# Patient Record
Sex: Male | Born: 1935 | Race: White | Hispanic: No | Smoking: Never smoker
Health system: Southern US, Community
[De-identification: ages and names within clinical notes are randomized; demographics above are authoritative.]

## PROBLEM LIST (undated history)

## (undated) DIAGNOSIS — H3530 Unspecified macular degeneration: Secondary | ICD-10-CM

## (undated) DIAGNOSIS — D649 Anemia, unspecified: Secondary | ICD-10-CM

## (undated) DIAGNOSIS — I4891 Unspecified atrial fibrillation: Secondary | ICD-10-CM

## (undated) DIAGNOSIS — E785 Hyperlipidemia, unspecified: Secondary | ICD-10-CM

## (undated) DIAGNOSIS — N400 Enlarged prostate without lower urinary tract symptoms: Secondary | ICD-10-CM

## (undated) DIAGNOSIS — G20 Parkinson's disease: Secondary | ICD-10-CM

## (undated) DIAGNOSIS — G20C Parkinsonism, unspecified: Secondary | ICD-10-CM

## (undated) HISTORY — PX: TONSILECTOMY/ADENOIDECTOMY WITH MYRINGOTOMY: SHX6125

## (undated) HISTORY — DX: Unspecified macular degeneration: H35.30

## (undated) HISTORY — DX: Unspecified atrial fibrillation: I48.91

## (undated) HISTORY — DX: Benign prostatic hyperplasia without lower urinary tract symptoms: N40.0

## (undated) HISTORY — DX: Hyperlipidemia, unspecified: E78.5

## (undated) HISTORY — DX: Anemia, unspecified: D64.9

---

## 2004-05-09 ENCOUNTER — Ambulatory Visit: Payer: Self-pay | Admitting: Family Medicine

## 2004-09-03 ENCOUNTER — Ambulatory Visit: Payer: Self-pay | Admitting: Family Medicine

## 2004-11-23 ENCOUNTER — Ambulatory Visit: Payer: Self-pay | Admitting: Family Medicine

## 2005-09-18 ENCOUNTER — Ambulatory Visit: Payer: Self-pay | Admitting: Family Medicine

## 2005-09-29 ENCOUNTER — Ambulatory Visit: Payer: Self-pay | Admitting: Family Medicine

## 2006-03-27 ENCOUNTER — Ambulatory Visit: Payer: Self-pay | Admitting: Family Medicine

## 2006-09-30 ENCOUNTER — Telehealth: Payer: Self-pay | Admitting: Family Medicine

## 2006-10-01 ENCOUNTER — Telehealth: Payer: Self-pay | Admitting: Family Medicine

## 2006-10-14 ENCOUNTER — Ambulatory Visit: Payer: Self-pay | Admitting: Family Medicine

## 2006-10-14 DIAGNOSIS — N390 Urinary tract infection, site not specified: Secondary | ICD-10-CM | POA: Insufficient documentation

## 2006-10-14 DIAGNOSIS — E039 Hypothyroidism, unspecified: Secondary | ICD-10-CM | POA: Insufficient documentation

## 2006-10-14 DIAGNOSIS — I10 Essential (primary) hypertension: Secondary | ICD-10-CM

## 2006-10-14 DIAGNOSIS — M810 Age-related osteoporosis without current pathological fracture: Secondary | ICD-10-CM | POA: Insufficient documentation

## 2006-10-15 LAB — CONVERTED CEMR LAB
AST: 25 units/L (ref 0–37)
Bilirubin, Direct: 0.2 mg/dL (ref 0.0–0.3)
Chloride: 108 meq/L (ref 96–112)
Eosinophils Absolute: 0.1 10*3/uL (ref 0.0–0.6)
Eosinophils Relative: 2.1 % (ref 0.0–5.0)
GFR calc non Af Amer: 76 mL/min
Glucose, Bld: 95 mg/dL (ref 70–99)
HCT: 47 % (ref 39.0–52.0)
Lymphocytes Relative: 26.6 % (ref 12.0–46.0)
MCV: 91.9 fL (ref 78.0–100.0)
Neutrophils Relative %: 59.5 % (ref 43.0–77.0)
PSA: 5.02 ng/mL — ABNORMAL HIGH (ref 0.10–4.00)
RBC: 5.12 M/uL (ref 4.22–5.81)
Sodium: 144 meq/L (ref 135–145)
Total Bilirubin: 1.3 mg/dL — ABNORMAL HIGH (ref 0.3–1.2)
Total Protein: 6.8 g/dL (ref 6.0–8.3)
Triglycerides: 138 mg/dL (ref 0–149)
WBC: 6.1 10*3/uL (ref 4.5–10.5)

## 2006-11-03 ENCOUNTER — Telehealth: Payer: Self-pay | Admitting: Family Medicine

## 2006-11-20 ENCOUNTER — Telehealth: Payer: Self-pay | Admitting: Family Medicine

## 2006-11-23 ENCOUNTER — Telehealth: Payer: Self-pay | Admitting: Family Medicine

## 2006-12-31 ENCOUNTER — Encounter: Payer: Self-pay | Admitting: Family Medicine

## 2007-02-10 ENCOUNTER — Ambulatory Visit: Payer: Self-pay | Admitting: Family Medicine

## 2007-02-10 DIAGNOSIS — Z87898 Personal history of other specified conditions: Secondary | ICD-10-CM

## 2007-02-23 ENCOUNTER — Ambulatory Visit: Payer: Self-pay

## 2007-02-23 ENCOUNTER — Encounter: Payer: Self-pay | Admitting: Family Medicine

## 2007-03-16 ENCOUNTER — Telehealth: Payer: Self-pay | Admitting: Family Medicine

## 2007-11-01 ENCOUNTER — Telehealth: Payer: Self-pay | Admitting: Family Medicine

## 2007-11-10 ENCOUNTER — Ambulatory Visit: Payer: Self-pay | Admitting: Family Medicine

## 2007-11-10 DIAGNOSIS — E785 Hyperlipidemia, unspecified: Secondary | ICD-10-CM

## 2007-11-10 DIAGNOSIS — D649 Anemia, unspecified: Secondary | ICD-10-CM

## 2007-11-10 LAB — CONVERTED CEMR LAB
Blood in Urine, dipstick: NEGATIVE
Nitrite: NEGATIVE
Protein, U semiquant: NEGATIVE
Urobilinogen, UA: 0.2
WBC Urine, dipstick: NEGATIVE

## 2007-11-15 LAB — CONVERTED CEMR LAB
ALT: 20 units/L (ref 0–53)
AST: 25 units/L (ref 0–37)
Alkaline Phosphatase: 41 units/L (ref 39–117)
BUN: 14 mg/dL (ref 6–23)
Basophils Absolute: 0 10*3/uL (ref 0.0–0.1)
Basophils Relative: 0.4 % (ref 0.0–3.0)
CO2: 29 meq/L (ref 19–32)
Chloride: 108 meq/L (ref 96–112)
Creatinine, Ser: 0.8 mg/dL (ref 0.4–1.5)
Direct LDL: 116.3 mg/dL
Eosinophils Relative: 1.6 % (ref 0.0–5.0)
HDL: 49.6 mg/dL (ref >39.0)
Lymphocytes Relative: 31.8 % (ref 12.0–46.0)
Neutrophils Relative %: 55.5 % (ref 43.0–77.0)
Platelets: 237 10*3/uL (ref 150–400)
Potassium: 4.1 meq/L (ref 3.5–5.1)
RBC: 4.64 M/uL (ref 4.22–5.81)
Total Bilirubin: 1.3 mg/dL — ABNORMAL HIGH (ref 0.3–1.2)
VLDL: 22 mg/dL (ref 0–40)
Vit D, 1,25-Dihydroxy: 49 (ref 30–89)
WBC: 6.2 10*3/uL (ref 4.5–10.5)

## 2007-11-17 ENCOUNTER — Encounter: Payer: Self-pay | Admitting: Family Medicine

## 2007-11-29 ENCOUNTER — Ambulatory Visit: Payer: Self-pay | Admitting: Internal Medicine

## 2008-02-25 ENCOUNTER — Ambulatory Visit: Payer: Self-pay | Admitting: Family Medicine

## 2008-02-28 LAB — CONVERTED CEMR LAB: PSA: 4.9 ng/mL — ABNORMAL HIGH (ref 0.10–4.00)

## 2009-01-18 ENCOUNTER — Telehealth: Payer: Self-pay | Admitting: Family Medicine

## 2009-02-21 ENCOUNTER — Telehealth: Payer: Self-pay | Admitting: Family Medicine

## 2009-02-23 ENCOUNTER — Ambulatory Visit: Payer: Self-pay | Admitting: Family Medicine

## 2009-02-28 ENCOUNTER — Ambulatory Visit: Payer: Self-pay | Admitting: Family Medicine

## 2009-02-28 DIAGNOSIS — R3915 Urgency of urination: Secondary | ICD-10-CM

## 2009-02-28 LAB — CONVERTED CEMR LAB
ALT: 22 units/L (ref 0–53)
AST: 27 units/L (ref 0–37)
Basophils Absolute: 0 10*3/uL (ref 0.0–0.1)
Bilirubin, Direct: 0.2 mg/dL (ref 0.0–0.3)
CO2: 28 meq/L (ref 19–32)
Chloride: 107 meq/L (ref 96–112)
Cholesterol: 215 mg/dL — ABNORMAL HIGH (ref 0–200)
Creatinine, Ser: 1 mg/dL (ref 0.4–1.5)
Direct LDL: 137.3 mg/dL
Eosinophils Relative: 3.3 % (ref 0.0–5.0)
HCT: 44 % (ref 39.0–52.0)
HDL: 52.6 mg/dL (ref >39.00)
Ketones, ur: NEGATIVE mg/dL
Leukocytes, UA: NEGATIVE
Lymphocytes Relative: 40.5 % (ref 12.0–46.0)
Lymphs Abs: 2.1 10*3/uL (ref 0.7–4.0)
Monocytes Relative: 11.5 % (ref 3.0–12.0)
Neutrophils Relative %: 44.5 % (ref 43.0–77.0)
Platelets: 209 10*3/uL (ref 150.0–400.0)
Potassium: 4.3 meq/L (ref 3.5–5.1)
RDW: 12.5 % (ref 11.5–14.6)
Specific Gravity, Urine: >=1.03 (ref 1.000–1.030)
TSH: 1.6 microintl units/mL (ref 0.35–5.50)
Total Bilirubin: 1 mg/dL (ref 0.3–1.2)
Total CHOL/HDL Ratio: 4
Triglycerides: 103 mg/dL (ref 0.0–149.0)
Urine Glucose: NEGATIVE mg/dL
Urobilinogen, UA: 0.2 (ref 0.0–1.0)
VLDL: 20.6 mg/dL (ref 0.0–40.0)
WBC: 5.2 10*3/uL (ref 4.5–10.5)
pH: 5.5 (ref 5.0–8.0)

## 2009-03-14 ENCOUNTER — Telehealth: Payer: Self-pay | Admitting: Family Medicine

## 2009-06-04 ENCOUNTER — Telehealth: Payer: Self-pay | Admitting: *Deleted

## 2009-09-28 ENCOUNTER — Ambulatory Visit: Payer: Self-pay | Admitting: Internal Medicine

## 2009-09-28 DIAGNOSIS — F980 Enuresis not due to a substance or known physiological condition: Secondary | ICD-10-CM

## 2009-10-01 ENCOUNTER — Encounter: Payer: Self-pay | Admitting: Internal Medicine

## 2009-10-01 ENCOUNTER — Telehealth: Payer: Self-pay | Admitting: Internal Medicine

## 2009-10-01 DIAGNOSIS — R300 Dysuria: Secondary | ICD-10-CM | POA: Insufficient documentation

## 2009-10-08 ENCOUNTER — Telehealth: Payer: Self-pay | Admitting: Internal Medicine

## 2009-10-23 ENCOUNTER — Telehealth: Payer: Self-pay | Admitting: Internal Medicine

## 2009-10-29 ENCOUNTER — Encounter: Payer: Self-pay | Admitting: Internal Medicine

## 2009-10-30 ENCOUNTER — Telehealth: Payer: Self-pay | Admitting: Family Medicine

## 2009-11-28 ENCOUNTER — Encounter: Payer: Self-pay | Admitting: Internal Medicine

## 2009-12-27 ENCOUNTER — Encounter: Payer: Self-pay | Admitting: Internal Medicine

## 2010-01-29 ENCOUNTER — Encounter: Payer: Self-pay | Admitting: Internal Medicine

## 2010-02-12 NOTE — Progress Notes (Signed)
Summary: samples  Phone Note Call from Patient Call back at Home Phone [phone]   Caller: Patient Call For: Judithann Sheen MD Summary of Call: was given samples Toviaz 4mg - maybe helps- wants to know if you have any samples of the 8mg ? Initial call taken by: Raechel Ache, RN,  March 14, 2009 9:22 AM  Follow-up for Phone Call        i only have 1 box of 14 tablets that i will leave up front for him  Follow-up by: Pura Spice, RN,  March 14, 2009 9:40 AM  Additional Follow-up for Phone Call Additional follow up Details #1::        Phone Call Completed Additional Follow-up by: Raechel Ache, RN,  March 14, 2009 9:47 AM

## 2010-02-12 NOTE — Progress Notes (Signed)
Summary: refills  Phone Note Refill Request Message from:  Fax from Pharmacy  Refills Requested: Medication #1:  ENABLEX 15 MG TB24 once daily  Medication #2:  FLOMAX 0.4 MG  CP24 once daily   Brand Name Necessary? No [facility] [phone] fax---[phone]  Initial call taken by: Warnell Forester,  February 21, 2009 8:51 AM  Follow-up for Phone Call        ok pt has cpx on Feb 28 2009  Follow-up by: Pura Spice, RN,  February 21, 2009 9:38 AM    New/Updated Medications: FLOMAX 0.4 MG  CP24 (TAMSULOSIN HCL) once daily Prescriptions: ENABLEX 15 MG TB24 (DARIFENACIN HYDROBROMIDE) once daily  #30 x 1   Entered by:   Pura Spice, RN   Authorized by:   Judithann Sheen MD   Signed by:   Pura Spice, RN on 02/21/2009   Method used:   Electronically to        [facility]* (retail)       [address]       [place], [place]  [address]       Ph: [phone]       Fax: [phone]   RxID:   [id number] FLOMAX 0.4 MG  CP24 (TAMSULOSIN HCL) once daily  #30 x 1   Entered by:   Pura Spice, RN   Authorized by:   Judithann Sheen MD   Signed by:   Pura Spice, RN on 02/21/2009   Method used:   Electronically to        [facility]* (retail)       [address]       [place], [place]  [address]       Ph: [phone]       Fax: [phone]   RxID:   [id number]

## 2010-02-12 NOTE — Progress Notes (Signed)
Summary: refill jayln  Phone Note Call from Patient   Caller: Patient Reason for Call: Talk to Doctor Summary of Call: needs rx for jaylyn to [place].    I see both jayln and flomax - please advise. KIK Initial call taken by: Duard Brady LPN,  October 23, 2009 4:45 PM  Follow-up for Phone Call        called pt - he has been instructed to take both - 2 by mouth once daily -  flomax was refilled in sept.   please advise ok to fill 2 jayln once daily to [place]. KIK Follow-up by: Duard Brady LPN,  October 23, 2009 4:49 PM  Additional Follow-up for Phone Call Additional follow up Details #1::        Jaylyn  ONE daily  #90  Additional Follow-up by: Gordy Savers  MD,  October 23, 2009 5:00 PM    Prescriptions: Haynes Bast 0.5-0.4 MG CAPS (DUTASTERIDE-TAMSULOSIN HCL) one daily  #50 x 3   Entered by:   Duard Brady LPN   Authorized by:   Gordy Savers  MD   Signed by:   Duard Brady LPN on 11/09/2534   Method used:   Faxed to ...       [facility]* (retail)       [address]       [place], [place]  [address]       Ph: [phone]       Fax: [phone]   RxID:   [id number]

## 2010-02-12 NOTE — Assessment & Plan Note (Signed)
Summary: dysuria/dm   Vital Signs:  Patient profile:   77 year old male Weight:      183 pounds Temp:     97.7 degrees F oral BP sitting:   140 / 80  (right arm) Cuff size:   large  Vitals Entered By: Duard Brady LPN (September 28, 2009 12:38 PM) CC: c/o difficult urination , low back pain  Is Patient Diabetic? No   CC:  c/o difficult urination  and low back pain .  History of Present Illness: 77 year old patient who has a history of BPH and a PSA level that ranges from 5 to 6.  He has been on Flomax for some time and more recently enablex has been added to his regimen due to urinary frequency.  He uses enablex in the morning and Flomax at bedtime.  throughout the day, he has noted decreasing  voiding intervals and earlier in the day is able to go  3 hours without urgency and need to void.  Towards the end of the day.  He has more frequency and urgency.  For the past couple of days.  He has had significant urgency with pain and inability to void late in the day and during the night.  Allergies: 1)  ! Pcn 2)  ! Sulfa  Physical Exam  General:  Well-developed,well-nourished,in no acute distress; alert,appropriate and cooperative throughout examination   Impression & Recommendations:  Problem # 1:  URINARY URGENCY (ZOX-096.04)  Problem # 2:  BENIGN PROSTATIC HYPERTROPHY, MILD, HX OF (ICD-V13.8) will discontinue enablex and give 6 weeks of Jalyn  Complete Medication List: 1)  Flomax 0.4 Mg Cp24 (Tamsulosin hcl) .... Once daily 2)  Adult Aspirin Low Strength 81 Mg Tbdp (Aspirin) 3)  Omega-3 350 Mg Caps (Omega-3 fatty acids) .... 3 am and 3 hs 4)  Jalyn 0.5-0.4 Mg Caps (Dutasteride-tamsulosin hcl) .... One daily 5)  Tramadol Hcl 50 Mg Tabs (Tramadol hcl) .... One every 6 hours for pain  Other Orders: UA Dipstick w/o Micro (manual) (54098)  Patient Instructions: 1)  discontinue Enablex 2)  Please schedule a follow-up appointment in 6  weeks Prescriptions: TRAMADOL  HCL 50 MG TABS (TRAMADOL HCL) one every 6 hours for pain  #50 x 2   Entered and Authorized by:   Gordy Savers  MD   Signed by:   Gordy Savers  MD on 09/28/2009   Method used:   Electronically to        [facility]* (retail)       [address]       [place], [place]  [address]       Ph: [phone]       Fax: [phone]   RxID:   [id number] Haynes Bast 0.5-0.4 MG CAPS (DUTASTERIDE-TAMSULOSIN HCL) one daily  #50 x 3   Entered and Authorized by:   Gordy Savers  MD   Signed by:   Gordy Savers  MD on 09/28/2009   Method used:   Electronically to        [facility]* (retail)       [address]       [place], [place]  [address]       Ph: [phone]       Fax: [phone]   RxID:   [id number]   Appended Document: dysuria/dm  Flu Vaccine Consent Questions     Do you have a history of severe allergic reactions to this vaccine? no  Any prior history of allergic reactions to egg and/or gelatin? no    Do you have a sensitivity to the preservative Thimersol? no    Do you have a past history of Guillan-Barre Syndrome? no    Do you currently have an acute febrile illness? no    Have you ever had a severe reaction to latex? no    Vaccine information given and explained to patient? yes    Are you currently pregnant? no    Lot Number:[id number]   Exp Date:07/13/2010   Site Given  Left Deltoid IM    Allergies: 1)  ! Pcn 2)  ! Sulfa   Complete Medication List: 1)  Flomax 0.4 Mg Cp24 (Tamsulosin hcl) .... Once daily 2)  Adult Aspirin Low Strength 81 Mg Tbdp (Aspirin) 3)  Omega-3 350 Mg Caps (Omega-3 fatty acids) .... 3 am and 3 hs 4)  Jalyn 0.5-0.4 Mg Caps (Dutasteride-tamsulosin hcl) .... One daily 5)  Tramadol Hcl 50 Mg Tabs (Tramadol hcl) .... One every 6 hours for pain  Other Orders: Admin 1st Vaccine ([id number]) Flu Vaccine 41yrs + [phone])   Laboratory Results   Urine Tests  Date/Time Received: September 28, 2009 1:26 PM  Date/Time Reported: September 28, 2009 1:26 PM   Routine Urinalysis   Color: yellow Appearance: Clear Glucose: negative   (Normal Range: Negative) Bilirubin: negative   (Normal Range: Negative) Ketone: negative   (Normal Range: Negative) Spec. Gravity: 1.020   (Normal Range: 1.003-1.035) Blood: trace-intact   (Normal Range: Negative) pH: 5.0   (Normal Range: 5.0-8.0) Protein: negative   (Normal Range: Negative) Urobilinogen: 0.2   (Normal Range: 0-1) Nitrite: negative   (Normal Range: Negative) Leukocyte Esterace: negative   (Normal Range: Negative)

## 2010-02-12 NOTE — Assessment & Plan Note (Signed)
Summary: cpx   Vital Signs:  Patient profile:   77 year old male Height:      67 inches Weight:      188 pounds O2 Sat:      98 % Temp:     97.7 degrees F Pulse rate:   62 / minute Resp:     16 per minute BP sitting:   140 / 90  (left arm) Cuff size:   large  Vitals Entered By: Pura Spice, RN (February 28, 2009 1:29 PM) CC: go over meds reillls discus meds not had a colonscopy  states saw dr Ladona Ridgel other yr and no need for return per pt.  Is Patient Diabetic? No   History of Present Illness: This 77 yr old white male who is in to dicus problems and refill medications Main complaint is in regard to some urinary urgency and occasional nocturi X 2. patient takes Flomax and flaxseed nO CONCERN ABOUT ED. ROS essentially negatic Needs medicines refilled Refuses to have colonoscopic exam  Allergies: 1)  ! Pcn 2)  ! Sulfa  Past History:  Past Medical History: Last updated: 10/12/2006 Pneumonia, hx of  Past Surgical History: Last updated: 10/12/2006 Colonoscopy  Social History: Last updated: 10/12/2006 Retired Single Never Smoked  Risk Factors: Smoking Status: never (10/12/2006)  Review of Systems      See HPI General:  See HPI; Denies chills, fatigue, fever, loss of appetite, malaise, sleep disorder, sweats, weakness, and weight loss. Eyes:  Denies blurring, discharge, double vision, eye irritation, eye pain, halos, itching, light sensitivity, red eye, vision loss-1 eye, and vision loss-both eyes. ENT:  Denies decreased hearing, difficulty swallowing, ear discharge, earache, hoarseness, nasal congestion, nosebleeds, postnasal drainage, ringing in ears, sinus pressure, and sore throat. CV:  labile hypertension. Resp:  Denies chest discomfort, chest pain with inspiration, cough, coughing up blood, excessive snoring, hypersomnolence, morning headaches, pleuritic, shortness of breath, sputum productive, and wheezing. GI:  Denies abdominal pain, bloody stools, change in  bowel habits, constipation, dark tarry stools, diarrhea, excessive appetite, gas, hemorrhoids, indigestion, loss of appetite, nausea, vomiting, vomiting blood, and yellowish skin color. GU:  Complains of nocturia; urinary urgency. Derm:  See HPI; Denies changes in color of skin, changes in nail beds, dryness, excessive perspiration, flushing, hair loss, insect bite(s), itching, lesion(s), poor wound healing, and rash. Neuro:  Denies brief paralysis, difficulty with concentration, disturbances in coordination, falling down, headaches, inability to speak, memory loss, numbness, poor balance, seizures, sensation of room spinning, tingling, tremors, visual disturbances, and weakness. Psych:  Denies alternate hallucination ( auditory/visual), anxiety, depression, easily angered, easily tearful, irritability, mental problems, panic attacks, sense of great danger, suicidal thoughts/plans, thoughts of violence, unusual visions or sounds, and thoughts /plans of harming others. Endo:  Denies cold intolerance, excessive hunger, excessive thirst, excessive urination, heat intolerance, polyuria, and weight change.  Physical Exam  General:  Well-developed,well-nourished,in no acute distress; alert,appropriate and cooperative throughout examination Head:  Normocephalic and atraumatic without obvious abnormalities. No apparent alopecia or balding. Eyes:  No corneal or conjunctival inflammation noted. EOMI. Perrla. Funduscopic exam benign, without hemorrhages, exudates or papilledema. Vision grossly normal. Ears:  External ear exam shows no significant lesions or deformities.  Otoscopic examination reveals clear canals, tympanic membranes are intact bilaterally without bulging, retraction, inflammation or discharge. Hearing is grossly normal bilaterally. Nose:  External nasal examination shows no deformity or inflammation. Nasal mucosa are pink and moist without lesions or exudates. Mouth:  Oral mucosa and oropharynx  without lesions or  exudates.  Teeth in good repair. Neck:  No deformities, masses, or tenderness noted. Chest Wall:  No deformities, masses, tenderness or gynecomastia noted. Breasts:  No masses or gynecomastia noted Lungs:  Normal respiratory effort, chest expands symmetrically. Lungs are clear to auscultation, no crackles or wheezes. Heart:  Normal rate and regular rhythm. S1 and S2 normal without gallop, murmur, click, rub or other extra sounds. Abdomen:  Bowel sounds positive,abdomen soft and non-tender without masses, organomegaly or hernias noted. Rectal:  No external abnormalities noted. Normal sphincter tone. No rectal masses or tenderness. Genitalia:  Testes bilaterally descended without nodularity, tenderness or masses. No scrotal masses or lesions. No penis lesions or urethral discharge. Prostate:  no nodules, no asymmetry, and 1+ enlarged.   Msk:  No deformity or scoliosis noted of thoracic or lumbar spine.   Pulses:  R and L carotid,radial,femoral,dorsalis pedis and posterior tibial pulses are full and equal bilaterally Extremities:  No clubbing, cyanosis, edema, or deformity noted with normal full range of motion of all joints.   Neurologic:  No cranial nerve deficits noted. Station and gait are normal. Plantar reflexes are down-going bilaterally. DTRs are symmetrical throughout. Sensory, motor and coordinative functions appear intact. Skin:  Intact without suspicious lesions or rashes Cervical Nodes:  No lymphadenopathy noted Axillary Nodes:  No palpable lymphadenopathy Inguinal Nodes:  No significant adenopathy Psych:  Cognition and judgment appear intact. Alert and cooperative with normal attention span and concentration. No apparent delusions, illusions, hallucinations   Impression & Recommendations:  Problem # 1:  HYPERLIPIDEMIA (ICD-272.4) Assessment Unchanged  Problem # 2:  BENIGN PROSTATIC HYPERTROPHY, MILD, HX OF (ICD-V13.8) Assessment: Unchanged  Problem # 3:   URINARY URGENCY (VOZ-366.44) Assessment: Unchanged  Problem # 4:  HYPERTENSION, LABILE, HX OF (ICD-V12.50) Assessment: Improved  Orders: EKG w/ Interpretation (93000)  Complete Medication List: 1)  Flomax 0.4 Mg Cp24 (Tamsulosin hcl) .... Once daily 2)  Adult Aspirin Low Strength 81 Mg Tbdp (Aspirin) 3)  Enablex 15 Mg Tb24 (Darifenacin hydrobromide) .... Once daily 4)  Omega-3 350 Mg Caps (Omega-3 fatty acids) .... 3 am and 3 hs  Patient Instructions: 1)  you've are doing very well and I recommend you continue the medications that you're on at this time lab studies are good 2)  Continue your regular exercise, riding the bicycle as you're doing Prescriptions: ENABLEX 15 MG TB24 (DARIFENACIN HYDROBROMIDE) once daily  #30 x 11   Entered and Authorized by:   Judithann Sheen MD   Signed by:   Judithann Sheen MD on 02/28/2009   Method used:   Electronically to        [facility]* (retail)       [address]       [place], [place]  [address]       Ph: [phone]       Fax: [phone]   RxID:   [id number] ENABLEX 15 MG TB24 (DARIFENACIN HYDROBROMIDE) once daily  #30 x 11   Entered and Authorized by:   Judithann Sheen MD   Signed by:   Judithann Sheen MD on 02/28/2009   Method used:   Electronically to        [facility]* (retail)       [address]       [place], [place]  [address]       Ph: [phone]       Fax: [phone]   RxID:   [id number] [facility]  0.4 MG  CP24 (TAMSULOSIN HCL) once daily  #30 x 11   Entered and Authorized by:   Judithann Sheen MD   Signed by:   Judithann Sheen MD on 02/28/2009   Method used:   Electronically to        [facility]* (retail)       [address]       [place], [place]  [address]       Ph: [phone]       Fax: [phone]   RxID:   [id number]

## 2010-02-12 NOTE — Progress Notes (Signed)
Summary: rx  enablex   Phone Note Refill Request   Refills Requested: Medication #1:  ENABLEX 15 MG TB24 once daily [facility] ph-----[phone]      fax----[phone]  Initial call taken by: Warnell Forester,  January 18, 2009 8:21 AM  Follow-up for Phone Call        ok x 1 then needs cpx appt  Follow-up by: Pura Spice, RN,  January 18, 2009 8:24 AM    New/Updated Medications: ENABLEX 15 MG TB24 (DARIFENACIN HYDROBROMIDE) once daily Prescriptions: ENABLEX 15 MG TB24 (DARIFENACIN HYDROBROMIDE) once daily  #30 x 0   Entered by:   Pura Spice, RN   Authorized by:   Judithann Sheen MD   Signed by:   Pura Spice, RN on 01/18/2009   Method used:   Electronically to        [facility]* (retail)       [address]       [place], [place]  [address]       Ph: [phone]       Fax: [phone]   RxID:   [id number]

## 2010-02-12 NOTE — Consult Note (Signed)
Summary: [facility]  [facility]   Imported By: Maryln Gottron 10/08/2009 13:35:51  _____________________________________________________________________  External Attachment:    Type:   Image     Comment:   External Document

## 2010-02-12 NOTE — Progress Notes (Signed)
Summary: flomax doubled  Phone Note Call from Patient Call back at [facility] Phone [phone]   Summary of Call: Dr. Kirtland Bouchard sent me To Dr. Gildardo Griffes for urgency.  Has done cauterization.  Plan now daily 2  Flomax and 2 Jaylen, samples, will face that when needed, but  need Rx doubled to take 2 Flomax daily to [facility].   Initial call taken by: Rudy Jew, RN,  October 08, 2009 9:58 AM  Follow-up for Phone Call        okay to refill generic, Flomax 0.4 mg, number 180 to use b.i.d. refill x 4 Follow-up by: Gordy Savers  MD,  October 08, 2009 12:23 PM    New/Updated Medications: FLOMAX 0.4 MG CAPS (TAMSULOSIN HCL) One two times a day Prescriptions: FLOMAX 0.4 MG CAPS (TAMSULOSIN HCL) One two times a day  #180 x 4   Entered by:   Rudy Jew, RN   Authorized by:   Gordy Savers  MD   Signed by:   Rudy Jew, RN on 10/08/2009   Method used:   Electronically to        [facility]* (retail)       [address]       [place], [place]  [address]       Ph: [phone]       Fax: [phone]   RxID:   [id number]

## 2010-02-12 NOTE — Letter (Signed)
Summary: [facility]  [facility]   Imported By: Maryln Gottron 11/05/2009 15:51:47  _____________________________________________________________________  External Attachment:    Type:   Image     Comment:   External Document

## 2010-02-12 NOTE — Progress Notes (Signed)
Summary: new rx needed  Phone Note Call from Patient Call back at Home Phone [phone]   Caller: Adam Garner mail Summary of Call: was referred to Dr Briant Cedar, and was rx'd samples of Jalyn. Dr Scotty Court had rx'd Avodart in the past. However, Haynes Bast is not a covered med through his ins. Avodart is only half the strength of Jalyn. Pt is requesting a new rx for Advodart  #60 per month. please call in to [facility] [place]. Initial call taken by: Warnell Forester,  October 30, 2009 9:26 AM  Follow-up for Phone Call        I don't see Avodart on pts med list? Please advise? Follow-up by: Josph Macho RMA,  October 30, 2009 9:45 AM  Additional Follow-up for Phone Call Additional follow up Details #1::        Please have patient tell us when Dr Scotty Court gave this too him. I will write for Avodart fot 2 months. 0.5mg  tab 1 tab by mouth once daily #30, 1 rf Additional Follow-up by: Danise Edge MD,  October 30, 2009 9:50 AM    Additional Follow-up for Phone Call Additional follow up Details #2::    I tried to call pt back no answer no vm Follow-up by: Josph Macho RMA,  October 30, 2009 11:08 AM  New/Updated Medications: AVODART 0.5 MG CAPS (DUTASTERIDE) 1 tab by mouth once daily Prescriptions: AVODART 0.5 MG CAPS (DUTASTERIDE) 1 tab by mouth once daily  #30 x 2   Entered by:   Josph Macho RMA   Authorized by:   Danise Edge MD   Signed by:   Josph Macho RMA on 10/30/2009   Method used:   Electronically to        [facility]* (retail)       [address]       [place], [place]  [address]       Ph: [phone]       Fax: [phone]   RxID:   [id number]

## 2010-02-12 NOTE — Progress Notes (Signed)
Summary: NEW RX  Phone Note Call from Patient Call back at Home Phone [phone]   Caller: Patient Call For: Judithann Sheen MD Summary of Call: PT [facility]  [address] [place] [phone]  Initial call taken by: Heron Sabins,  Jun 04, 2009 9:28 AM  Follow-up for Phone Call        Rx sent to pharmacy- Per Dr. Scotty Court okay to do x 1 year. Follow-up by: Romualdo Bolk, CMA (AAMA),  Jun 04, 2009 1:11 PM    Prescriptions: ENABLEX 15 MG TB24 (DARIFENACIN HYDROBROMIDE) once daily  #30 x 11   Entered by:   Romualdo Bolk, CMA (AAMA)   Authorized by:   Judithann Sheen MD   Signed by:   Romualdo Bolk, CMA (AAMA) on 06/04/2009   Method used:   Electronically to        [facility]* (retail)       [address]       [place], [place]  [address]       Ph: [phone]       Fax: [phone]   RxID:   [id number]

## 2010-02-12 NOTE — Letter (Signed)
Summary: [facility]  [facility]   Imported By: Maryln Gottron 12/04/2009 09:38:54  _____________________________________________________________________  External Attachment:    Type:   Image     Comment:   External Document

## 2010-02-12 NOTE — Progress Notes (Signed)
Summary: stopped up urine & bm Dr. Kirtland Bouchard call please  Phone Note Call from Patient Call back at Home Phone [phone]   Caller: vm Reason for Call: Talk to Doctor Summary of Call: Sat told To stop ?enaplix, gave Flomax & take Jaylen.  Locked me up completely. Pharmacist said not to take Jaylen.  Taking Flomax last night gave me good stream last night.  Am constipated.  Today cannot urinate.  What to do?  Locked up now both ways.  Dr. Kirtland Bouchard Call me ASAP.  [place].   Initial call taken by: Rudy Jew, RN,  October 01, 2009 12:19 PM  Follow-up for Phone Call        please schedule urology appt for today or tomarrow Follow-up by: Gordy Savers  MD,  October 01, 2009 12:52 PM  Additional Follow-up for Phone Call Additional follow up Details #1::        Phone Call Completed Additional Follow-up by: Rudy Jew, RN,  October 01, 2009 2:59 PM  New Problems: DYSURIA (ICD-788.1)   New Problems: DYSURIA (ICD-788.1)

## 2010-02-14 NOTE — Letter (Signed)
Summary: [facility]  [facility]   Imported By: Maryln Gottron 02/07/2010 11:02:50  _____________________________________________________________________  External Attachment:    Type:   Image     Comment:   External Document

## 2010-02-14 NOTE — Letter (Signed)
Summary: [facility]  [facility]   Imported By: Maryln Gottron 01/09/2010 11:23:33  _____________________________________________________________________  External Attachment:    Type:   Image     Comment:   External Document

## 2010-05-28 NOTE — Letter (Signed)
November 29, 2007    Tawny Asal, MD  [address] [place], [place] [address]   RE:  HOLMES, HAYS  MRN:  [id number]  /  DOB:  1921-04-13   Dear Annette Stable:   Thank you for referring Mr. Barbra Sarks for EP evaluation of bradycardia  in the setting of left ventricular hypertrophy.  I am not sure I have  all of the records available, but I do have the most recent clinic  notes.  As you know, Mr. Zinni is a very pleasant 77 year old man who has  done amazingly well despite his 77 years of age.  He has a history of  BPH, which has been for the most part controlled, as well he has a  history of stress testing back in February 2009 where the patient was  able to only walk for 3 minutes on a Bruce protocol, but had preserved  coronary perfusion and normal LV function.  There was evidence at that  time of LVH and the patient did have hypertensive response to exercise.  Despite this, the patient has really remained very stable.  He states  that he is able to exercise with a [employer] Airdyne exercise bike 5 days  a week without chest pain or shortness of breath and his energy level  and exercise abilities have really been unchanged from last year or 2.  The patient does have some bradycardia, but has never had syncope or  near syncope.  He denies chest pain or other symptoms at the present  time.  Past medical history is noted in the HPI and otherwise  unremarkable.  He does have first-degree heart block on his EKG.   SOCIAL HISTORY:  The patient is widowed.  He denies tobacco or ethanol  abuse as noted.  He exercises regularly.   FAMILY HISTORY:  Noncontributory other than his advanced age.   REVIEW OF SYSTEMS:  Negative except as noted in the HPI.  He does not  have much in the way of symptoms of BPH.   PHYSICAL EXAMINATION:  GENERAL:  Notable for him being a pleasant well-  appearing elderly man in no acute distress.  VITAL SIGNS:  The blood pressure today was 152/92, the pulse  54 and  regular, and the respirations were 18.  HEENT:  Normocephalic and atraumatic.  Pupils equal and round.  The  oropharynx is moist.  The sclerae are anicteric.  NECK:  No jugular venous distention.  There is no thyromegaly.  Trachea  is midline.  The carotid are 2+ and symmetric.  LUNGS:  Clear bilaterally to auscultation.  No wheezes, rales, or  rhonchi are present.  There is no increased work of breathing.  CARDIOVASCULAR:  Regular bradycardia with normal S1 and S2.  The PMI was  not enlarged, nor was it laterally displaced.  ABDOMEN:  Soft and nontender.  There is no organomegaly.  The bowel  sounds are present.  There is no rebound or guarding.  EXTREMITIES:  No cyanosis, clubbing, or edema.  The pulses were 2+ and  symmetric.  NEUROLOGIC:  Alert and oriented x3.  Cranial nerves intact.  Strength is  5/5 and symmetric.   The EKG demonstrated sinus bradycardia with first-degree AV block.   IMPRESSION:  1. Probable hypertension.  2. Sinus bradycardia which appears to be asymptomatic.   Annette Stable, Mr. Sangiovanni is amazingly stable at the present time.  My guess is  that serial blood pressure checks may well reveal that he  is in fact  hypertensive and if this turns out to be the case, medical therapy would  certainly be a reasonable thing.  In light of his bradycardia, beta-  blockers or nondihydropyridine calcium channel blockers might result in  more severe significant bradycardia.  An ARB would be a certainly a  consideration trial versus trying Norvasc.  The patient does have  evidence of LVH, which is mild by EKG as well as by stress perfusion  imaging, but again this is very mild and based on how well he is done  and how relatively asymptomatic, I think additional workup with 2D echo  or other testing would probably not be warranted at this time.  I have  counseled the patient that if he has symptomatic bradycardia and  discussed symptoms that he might experience with this or  other cardiac  symptoms, then I would be very happy to see him back, otherwise I will  refer back to [facility].  Please do not hesitate to contact me for  any questions regarding Mr. Alipio.  Once again, thanks for referring him  for Cardiology evaluation.    Sincerely,      Doylene Canning. Ladona Ridgel, MD  Electronically Signed    GWT/MedQ  DD: 11/29/2007  DT: 11/30/2007  Job #: [id number]

## 2010-10-31 ENCOUNTER — Other Ambulatory Visit: Payer: Self-pay | Admitting: Family Medicine

## 2010-10-31 ENCOUNTER — Encounter: Payer: Self-pay | Admitting: Family Medicine

## 2010-10-31 MED ORDER — DUTASTERIDE 0.5 MG PO CAPS: 0.5000 mg | ORAL_CAPSULE | Freq: Every day | ORAL | Status: DC

## 2010-10-31 MED ORDER — ALENDRONATE SODIUM 70 MG PO TABS: 70.0000 mg | ORAL_TABLET | ORAL | Status: DC

## 2010-11-01 ENCOUNTER — Encounter: Payer: Self-pay | Admitting: Family Medicine

## 2010-11-01 ENCOUNTER — Ambulatory Visit (INDEPENDENT_AMBULATORY_CARE_PROVIDER_SITE_OTHER): Payer: PRIVATE HEALTH INSURANCE | Admitting: Family Medicine

## 2010-11-01 DIAGNOSIS — M9981 Other biomechanical lesions of cervical region: Secondary | ICD-10-CM

## 2010-11-01 DIAGNOSIS — Z87898 Personal history of other specified conditions: Secondary | ICD-10-CM

## 2010-11-01 DIAGNOSIS — M999 Biomechanical lesion, unspecified: Secondary | ICD-10-CM

## 2010-11-01 DIAGNOSIS — Z8679 Personal history of other diseases of the circulatory system: Secondary | ICD-10-CM

## 2010-11-01 MED ORDER — TAMSULOSIN HCL 0.4 MG PO CAPS: 0.8000 mg | ORAL_CAPSULE | ORAL | Status: DC

## 2010-11-01 NOTE — Patient Instructions (Signed)
We will increase your flomax to 0.8mg  daily For the manipulation lets have you come back in 3 weeks.  Think about the cane when you go out on long walks.

## 2010-11-02 ENCOUNTER — Encounter: Payer: Self-pay | Admitting: Family Medicine

## 2010-11-02 DIAGNOSIS — M999 Biomechanical lesion, unspecified: Secondary | ICD-10-CM | POA: Insufficient documentation

## 2010-11-02 NOTE — Assessment & Plan Note (Signed)
After verbal consent pt did have muscle energy and articular technique, with marked improvement.  Gave side effects to look out for and can take anti inflammatories in the acute time frame.

## 2010-11-02 NOTE — Assessment & Plan Note (Signed)
Discussed with pt and need for better control pt will see dr. Bradd Canary and would like him to do labs in near future including PSA.

## 2010-11-02 NOTE — Progress Notes (Signed)
  Subjective:    Patient ID: Adam Garner, male    DOB: [birth year], 77 y.o.   MRN: [id number]  HPI 77 yo male here to establish care for manipulation only.  Pt has primary care in Dr. Scotty Court and goes and sees him annually  Pt also is seen by [facility] for BPH Pt give though history of his BPH and need to in and out cath self from time to time due to this problem.  Pt hough is stating that for a while he had to wear a catheter bag on his leg and noticed since then he walks more with a limp.Pt denies any pain in numbness in extremities.  Pt though over time has felt a little more unsteady on his feet over the coarse of the last year or so.  Pt denies any falls but has been a lot more careful.  Pt would like to see if any manipulation would be helpful.   Active Ambulatory Problems    Diagnosis Date Noted  . HYPOTHYROIDISM NOS 10/14/2006  . HYPERLIPIDEMIA 11/10/2007  . ANEMIA 11/10/2007  . ENURESIS 09/28/2009  . UTI 10/14/2006  . OSTEOPOROSIS 10/14/2006  . DYSURIA 10/01/2009  . URINARY URGENCY 02/28/2009  . HYPERTENSION, LABILE, HX OF 10/14/2006  . BENIGN PROSTATIC HYPERTROPHY, MILD, HX OF 02/10/2007   Resolved Ambulatory Problems    Diagnosis Date Noted  . No Resolved Ambulatory Problems   Past Medical History  Diagnosis Date  . Osteoporosis   . BPH (benign prostatic hyperplasia)    No past surgical history on file. Allergies  Allergen Reactions  . Penicillins   . Sulfonamide Derivatives     Review of Systems Denies fever, chills, nausea vomiting abdominal pain, dysuria, chest pain, shortness of breath dyspnea on exertion or numbness in extremities     Objective:     Physical Exam Gen: NAD healthy 77 yo male CV: RRR 1/6 SEM  Pul: CTAB Abd" BS+ NT ND  OMT Findings: Cervical: C4 rotated and side bent left, C6 rotated and side bent right Thoracic: T6 rotated and side bent left Lumbar:L3 rotated and side bent right Sacrum:left on left with anterior illium on  right Pt des ambulate with small shuffling gait but improved with greater swing length of leg after manipulation.     Assessment & Plan:  Preventitive-  Pt declined all preventive measures pt has younger brother with colon cancer but declined colonoscopy due to watching what he went trough with treatment.

## 2010-11-02 NOTE — Assessment & Plan Note (Signed)
With pt still having to in and out with no side effects to medication and high BP will give increase in flomax and see if it improves.

## 2010-11-02 NOTE — Assessment & Plan Note (Signed)
Marked improvement with articular technique and fpr. Pt knows of possible flare and need for NSAIDS Pt to return in 3 weeks Pt told due to gait would consider cain at this time to help him feel more confident with walking would consider B12 at next visit if pt would allow [place] to draw blood.

## 2010-11-19 ENCOUNTER — Ambulatory Visit (INDEPENDENT_AMBULATORY_CARE_PROVIDER_SITE_OTHER): Payer: PRIVATE HEALTH INSURANCE | Admitting: Family Medicine

## 2010-11-19 VITALS — BP 190/83 | HR 61 | Temp 98.3°F | Ht 67.0 in | Wt 192.2 lb

## 2010-11-19 DIAGNOSIS — M9981 Other biomechanical lesions of cervical region: Secondary | ICD-10-CM

## 2010-11-19 DIAGNOSIS — R2689 Other abnormalities of gait and mobility: Secondary | ICD-10-CM | POA: Insufficient documentation

## 2010-11-19 DIAGNOSIS — M999 Biomechanical lesion, unspecified: Secondary | ICD-10-CM

## 2010-11-19 DIAGNOSIS — Z87898 Personal history of other specified conditions: Secondary | ICD-10-CM

## 2010-11-19 DIAGNOSIS — R29818 Other symptoms and signs involving the nervous system: Secondary | ICD-10-CM

## 2010-11-19 MED ORDER — CYANOCOBALAMIN 1000 MCG PO TABS: 1000.0000 ug | ORAL_TABLET | Freq: Every day | ORAL | Status: DC

## 2010-11-19 MED ORDER — CALCIUM CARBONATE-VIT D-MIN 600-400 MG-UNIT PO CHEW: 1.0000 | CHEWABLE_TABLET | Freq: Two times a day (BID) | ORAL | Status: DC

## 2010-11-19 NOTE — Progress Notes (Signed)
  Subjective:    Patient ID: Adam Garner, male    DOB: April 15, 1921, 77 y.o.   MRN: [id number]  HPI Patient is here for followup of manipulation. Patient is doing very well states that his balance has seemed to improve somewhat. Patient has been doing some exercises at home and feeling much better but still having the same little aches and pains as previously stated at last visit.   Patient also since increasing his Flomax has been feeling pretty well actually increasing his urination on his own and only doing a self catheter approximately one time a day at this time.   Review of Systems Patient denies fever, chills, nausea, vomiting, diarrhea, constipation Past medical history, social, surgical and family history all reviewed.      Objective:   Physical Exam Gen: NAD healthy 77 yo male CV: RRR 1/6 SEM  Pul: CTAB Abd" BS+ NT ND  OMT Findings: Cervical: C4 flexed rotated and side bent left, C6 rotated and side bent right Thoracic: T6  Extended rotated and side bent left Lumbar:L3 rotated and side bent right Sacrum:left on left with anterior illium on right Pt des ambulate with small shuffling gait but improved with greater swing length of leg after manipulation.      Assessment & Plan:

## 2010-11-19 NOTE — Assessment & Plan Note (Signed)
Patient is a little balance problem likely secondary to cytopenia and age. Patient though also complains of some mild fatigue. we will treat with B12 orally at this time patient did not get labs today due to this no change in management. If patient does not seem to be absorbed and we can always get labs after patient has tried a for a while and see what his levels are low then we'll consider injectable form

## 2010-11-19 NOTE — Patient Instructions (Signed)
I will see you back in 3 weeks Start calcium and B 12 Stop the Fosamax.

## 2010-11-19 NOTE — Assessment & Plan Note (Signed)
Patient's after verbal consent had muscle energy as well as articular technique with marked improvement patient will return in 3 weeks for reevaluation

## 2010-11-19 NOTE — Assessment & Plan Note (Signed)
Patient continues to improve we'll continue his Flomax at 0.8 mg daily

## 2010-11-19 NOTE — Assessment & Plan Note (Signed)
Patient after verbal consent did have muscle energy as well as articular technique with marked improvement in range of motion

## 2010-12-19 ENCOUNTER — Encounter: Payer: Self-pay | Admitting: Family Medicine

## 2010-12-19 ENCOUNTER — Ambulatory Visit (INDEPENDENT_AMBULATORY_CARE_PROVIDER_SITE_OTHER): Payer: PRIVATE HEALTH INSURANCE | Admitting: Family Medicine

## 2010-12-19 DIAGNOSIS — Z87898 Personal history of other specified conditions: Secondary | ICD-10-CM

## 2010-12-19 DIAGNOSIS — M999 Biomechanical lesion, unspecified: Secondary | ICD-10-CM

## 2010-12-19 DIAGNOSIS — M9981 Other biomechanical lesions of cervical region: Secondary | ICD-10-CM

## 2010-12-19 DIAGNOSIS — Z8679 Personal history of other diseases of the circulatory system: Secondary | ICD-10-CM

## 2010-12-19 DIAGNOSIS — R29818 Other symptoms and signs involving the nervous system: Secondary | ICD-10-CM

## 2010-12-19 DIAGNOSIS — R2689 Other abnormalities of gait and mobility: Secondary | ICD-10-CM

## 2010-12-19 NOTE — Assessment & Plan Note (Signed)
Particular technique with marked improvement no HVLA due to osteoporosis.

## 2010-12-19 NOTE — Patient Instructions (Signed)
Good to see you. I when she to monitor your blood pressure daily and keep a log. If for some reason he started having headaches visual changes or numbness or weakness in your extremities please come back and see me. I would continue all your medications regularly. If he continued to have some coordination problems we can consider doing physical therapy for balance and coordination. I will see you again in 4-6 weeks and at that time we will get some baseline labs. Happy holidays and happy new year.

## 2010-12-19 NOTE — Assessment & Plan Note (Signed)
Discussed with patient at this time. We'll not make any changes the patient has a home meter and we'll continue to check it daily until I see patient again. Patient will check it after breakfast every day patient knows of red flags and when to seek medical attention.

## 2010-12-19 NOTE — Assessment & Plan Note (Signed)
Discussed with patient he can at this time. Stated that she do postvoid residuals if less than 100 cc of urine then would stop self cathing. Patient will also followup with a specialist.

## 2010-12-19 NOTE — Progress Notes (Signed)
  Subjective:    Patient ID: Adam Garner, male    DOB: 13-Sep-1921, 77 y.o.   MRN: [id number]  Back Pain   Patient is here for followup of manipulation. Patient is doing very well states that his balance has seemed to improve. Patient though continues to have some balance problem when to walking significant distance. Patient has to walk uphill couple blocks and when coming back home going downhill a couple blocks had some trouble. Patient has not fallen no hip pain patient states that he did have some knee pain the next day but that has gone away. Patient states the pain has caused him to stop any of his regular activities of daily living. Patient's is concerned that maybe some of the medication is causing him problems.   Patient also since increasing his Flomax has been feeling pretty well actually increasing his urination on his own and only doing a self catheter approximately one time a day at this time. Patient has not checked how much he is getting out when he self casts and has not done any post void residuals yet.   Review of Systems  Musculoskeletal: Positive for back pain.   Patient denies fever, chills, nausea, vomiting, diarrhea, constipation Past medical history, social, surgical and family history all reviewed.      Objective:   Physical Exam  Gen: NAD healthy 77 yo male CV: RRR 1/6 SEM right upper sternal border Pul: CTAB Abd" BS+ NT ND  OMT Findings: Cervical: C4 flexed rotated and side bent left Thoracic: T6  Extended rotated and side bent right Lumbar:L3 rotated and side bent right Sacrum:left on left with anterior illium on right Pt des ambulate with small shuffling gait but improved with greater swing length of leg after manipulation. \    Assessment & Plan:

## 2010-12-19 NOTE — Assessment & Plan Note (Signed)
Likely secondary to deconditioning continue to monitor closely. Patient was offered physical therapy for balance and conditioning and he did not want to do this at this time. Patient also declined having a cane or walker. Patient though will give [place] information to get a handicap sticker for his car and I will fill that out.

## 2010-12-19 NOTE — Assessment & Plan Note (Signed)
Particular technique sitting with marked increase range of motion patient also able to ambulate better.

## 2011-02-13 ENCOUNTER — Ambulatory Visit (INDEPENDENT_AMBULATORY_CARE_PROVIDER_SITE_OTHER): Payer: PRIVATE HEALTH INSURANCE | Admitting: Family Medicine

## 2011-02-13 ENCOUNTER — Encounter: Payer: Self-pay | Admitting: Family Medicine

## 2011-02-13 VITALS — BP 160/88 | HR 92 | Temp 97.8°F | Ht 67.0 in | Wt 189.2 lb

## 2011-02-13 DIAGNOSIS — D649 Anemia, unspecified: Secondary | ICD-10-CM

## 2011-02-13 DIAGNOSIS — E039 Hypothyroidism, unspecified: Secondary | ICD-10-CM

## 2011-02-13 DIAGNOSIS — M810 Age-related osteoporosis without current pathological fracture: Secondary | ICD-10-CM

## 2011-02-13 DIAGNOSIS — E785 Hyperlipidemia, unspecified: Secondary | ICD-10-CM

## 2011-02-13 DIAGNOSIS — Z8679 Personal history of other diseases of the circulatory system: Secondary | ICD-10-CM

## 2011-02-13 DIAGNOSIS — Z87898 Personal history of other specified conditions: Secondary | ICD-10-CM

## 2011-02-13 LAB — CBC
HCT: 44 % (ref 39.0–52.0)
MCHC: 34.5 g/dL (ref 30.0–36.0)
MCV: 89.8 fL (ref 78.0–100.0)
RDW: 13.7 % (ref 11.5–15.5)

## 2011-02-13 LAB — COMPREHENSIVE METABOLIC PANEL
CO2: 25 mEq/L (ref 19–32)
Calcium: 9.7 mg/dL (ref 8.4–10.5)
Creat: 1.05 mg/dL (ref 0.50–1.35)
Glucose, Bld: 103 mg/dL — ABNORMAL HIGH (ref 70–99)
Sodium: 139 mEq/L (ref 135–145)
Total Bilirubin: 0.5 mg/dL (ref 0.3–1.2)
Total Protein: 7.1 g/dL (ref 6.0–8.3)

## 2011-02-13 LAB — TSH: TSH: 1.357 u[IU]/mL (ref 0.350–4.500)

## 2011-02-13 LAB — T4, FREE: Free T4: 1.24 ng/dL (ref 0.80–1.80)

## 2011-02-13 MED ORDER — FINASTERIDE 5 MG PO TABS: 5.0000 mg | ORAL_TABLET | Freq: Every day | ORAL | Status: DC

## 2011-02-13 NOTE — Assessment & Plan Note (Signed)
History of hypothyroidism is on no medications at this time. Concerned that this could be something with his balance problem as well we will check thyroid function.

## 2011-02-13 NOTE — Assessment & Plan Note (Signed)
She has a history of hyperlipidemia is on no medications at this time after a long discussion patient will need to screening for hyperlipidemia again.

## 2011-02-13 NOTE — Assessment & Plan Note (Signed)
Patient continues to have some problems but improving. Patient is only catheterizing Himself one time a day. Patient states that he has no pain discussed that we should get a BUN and creatinine to make sure patient's kidney function is okay. Patient is following up with urologist next month.

## 2011-02-13 NOTE — Assessment & Plan Note (Signed)
Patient brought in chart of his daily blood pressures most of them are in the 130s to 150s systolic. Patient minorly high today but will do no other medications due to patient still having him come at this time. Home readings are doing well no red flags.

## 2011-02-13 NOTE — Assessment & Plan Note (Signed)
Patient has a history of anemia has had chronic balance problems. We will get a CBC as well as a B12. Patient is taking oral B12 and has improved since then.

## 2011-02-13 NOTE — Patient Instructions (Addendum)
Good to see you. I am going to send you to physical therapy this will take a couple weeks to set up. I'm giving you a new medicine called finasteride take this instead of your Avodart For your stomach I want you to try a probiotic find one over the counter called Culturelle I will get labs and call you with the results I should see you again in about 6-8 weeks.

## 2011-02-14 NOTE — Progress Notes (Signed)
  Subjective:    Patient ID: Adam Garner, male    DOB: 02/16/1921, 78 y.o.   MRN: [id number]  Back Pain   Patient is here for followup of manipulation. Patient is doing very well states that his balance has seemed to improve. Patient also started taking vitamin B 12 and states that he thinks this hasn't helped his balance as well. Patient states he can now walk about a quarter of a mile before he starts to feel unstable on his feet. Denies numbness or weakness in extremities more just an overall feeling of fatigue patient though denies shortness of breath. Patient has not fallen no hip pain patient states that he did have some knee pain the next day but that has gone away. Patient states the pain has caused him to stop any of his regular activities of daily living.   Patient also since increasing his Flomax has been feeling pretty well actually increasing his urination on his own and only doing a self catheter approximately one time a day at this time. Patient has not checked how much he is getting out when he self casts and has not done any post void residuals yet.  Hyperlipidemia-patient has not had this checked in multiple years we'll get a blood draw today. Patient is on no medication at this time.   Hypertension Blood pressure at home: 130s to 150s systolic Blood pressure today: 160/88 Taking Meds: No except for his prostate medications that can lower his blood pressure Side effects: No ROS: Denies headache visual changes nausea, vomiting, chest pain or abdominal pain or shortness of breath.  Review of Systems  Musculoskeletal: Positive for back pain.   Patient denies fever, chills, nausea, vomiting, diarrhea, constipation Past medical history, social, surgical and family history all reviewed.      Objective:   Physical Exam  Gen: NAD healthy 78 yo male CV: RRR 1/6 SEM right upper sternal border Pul: CTAB Abd" BS+ NT ND  OMT Findings: Cervical: C4 flexed rotated and side bent  left Thoracic: T6  Extended rotated and side bent right Lumbar:L3 rotated and side bent right Sacrum:left on left with anterior illium on right Pt des ambulate with small shuffling gait but improved with greater swing length of leg after manipulation.     Assessment & Plan:

## 2011-02-28 ENCOUNTER — Telehealth: Payer: Self-pay | Admitting: Family Medicine

## 2011-02-28 DIAGNOSIS — R2689 Other abnormalities of gait and mobility: Secondary | ICD-10-CM

## 2011-02-28 NOTE — Telephone Encounter (Signed)
Will send again when you do call patient please apologize it must have not been entered correctly by me.

## 2011-02-28 NOTE — Telephone Encounter (Signed)
No referral in chart, will forward to MD. Milas Gain, Maryjo [place]

## 2011-02-28 NOTE — Telephone Encounter (Signed)
Pt is asking about his referral to PT - hasn't heard anything yet. °

## 2011-02-28 NOTE — Telephone Encounter (Signed)
Pt informed.  The referral was placed in the [facility] and faxed paper request as well. Shaida Route, Maryjo [place]

## 2011-04-10 ENCOUNTER — Encounter: Payer: Self-pay | Admitting: Family Medicine

## 2011-04-10 ENCOUNTER — Other Ambulatory Visit: Payer: Self-pay | Admitting: Family Medicine

## 2011-04-10 ENCOUNTER — Ambulatory Visit (INDEPENDENT_AMBULATORY_CARE_PROVIDER_SITE_OTHER): Payer: PRIVATE HEALTH INSURANCE | Admitting: Family Medicine

## 2011-04-10 VITALS — BP 160/82 | HR 73 | Temp 97.5°F | Ht 67.0 in | Wt 185.2 lb

## 2011-04-10 DIAGNOSIS — M9981 Other biomechanical lesions of cervical region: Secondary | ICD-10-CM

## 2011-04-10 DIAGNOSIS — Z8679 Personal history of other diseases of the circulatory system: Secondary | ICD-10-CM

## 2011-04-10 DIAGNOSIS — M999 Biomechanical lesion, unspecified: Secondary | ICD-10-CM

## 2011-04-10 DIAGNOSIS — R2689 Other abnormalities of gait and mobility: Secondary | ICD-10-CM

## 2011-04-10 DIAGNOSIS — R29818 Other symptoms and signs involving the nervous system: Secondary | ICD-10-CM

## 2011-04-10 NOTE — Patient Instructions (Signed)
It is good to see you. You were doing fantastic! Keep up with the exercises are doing great things. I should see you again in 2 months.

## 2011-04-10 NOTE — Assessment & Plan Note (Signed)
Decision today to treat with OMT was based on Physical Exam  After verbal consent patient was treated with radicular and muscle energy techniques in thoracic and cervical and lumbar areas  Patient tolerated the procedure well with improvement in symptoms  Patient given exercises, stretches and lifestyle modifications  See medications in patient instructions if given  Patient will follow up in 8 weeks

## 2011-04-10 NOTE — Assessment & Plan Note (Signed)
As stated under thoracic area.

## 2011-04-10 NOTE — Assessment & Plan Note (Signed)
Patient is still little bit elevated but overall doing well. Patient is having no symptoms of headache or dizziness or visual changes. Our goal is to have patient start blood pressure less than 160/80 and he is close. Continue current therapy.

## 2011-04-10 NOTE — Assessment & Plan Note (Signed)
We'll continue patient's intense physical therapy with patient improving so much. Discussed proper nutrition intake including protein. Encourage patient to continue with potential weight loss.

## 2011-04-10 NOTE — Progress Notes (Signed)
  Subjective:    Patient ID: Adam Garner, male    DOB: [birth year], 78 y.o.   MRN: [id number]  Back Pain  Patient is here for followup of manipulation. Patient started doing physical therapy and has noticed is remarkable difference in his balance. Patient is able to do many more activities of daily living on his own without feeling self-conscious about his balance for the potential following. Patient is also lost weight which he thinks has helped tremendously. Patient is working out 6 days a week for one hour with a Systems analyst and has notice a large improvement overall. Patient is to continue this and continue to improve. Patient still is having a little bit of aches and pains after exercising and increase activity but otherwise feeling well.    Hypertension Blood pressure at home: 130s to 150s systolic Blood pressure today: 160/82 on recheck Taking Meds: No except for his prostate medications that can lower his blood pressure Side effects: No ROS: Denies headache visual changes nausea, vomiting, chest pain or abdominal pain or shortness of breath.  Review of Systems  Musculoskeletal: Positive for back pain.   Patient denies fever, chills, nausea, vomiting, diarrhea, constipation Past medical history, social, surgical and family history all reviewed.      Objective:   Physical Exam  Gen: NAD healthy 78 yo male CV: RRR 1/6 SEM right upper sternal border Pul: CTAB Abd" BS+ NT ND  OMT Findings: Cervical: Neutral Thoracic: T7  Extended rotated and side bent right Lumbar:L2 rotated and side bent right Sacrum:left on left with anterior illium on right Pt does ambulate with  much more balance increase stride length Assessment & Plan:

## 2011-06-13 ENCOUNTER — Encounter: Payer: Self-pay | Admitting: Family Medicine

## 2011-06-13 ENCOUNTER — Ambulatory Visit (INDEPENDENT_AMBULATORY_CARE_PROVIDER_SITE_OTHER): Payer: PRIVATE HEALTH INSURANCE | Admitting: Family Medicine

## 2011-06-13 VITALS — BP 168/92 | HR 84 | Ht 67.0 in | Wt 186.0 lb

## 2011-06-13 DIAGNOSIS — M810 Age-related osteoporosis without current pathological fracture: Secondary | ICD-10-CM

## 2011-06-13 DIAGNOSIS — M999 Biomechanical lesion, unspecified: Secondary | ICD-10-CM

## 2011-06-13 DIAGNOSIS — N419 Inflammatory disease of prostate, unspecified: Secondary | ICD-10-CM

## 2011-06-13 DIAGNOSIS — Z8679 Personal history of other diseases of the circulatory system: Secondary | ICD-10-CM

## 2011-06-13 DIAGNOSIS — M9981 Other biomechanical lesions of cervical region: Secondary | ICD-10-CM

## 2011-06-13 MED ORDER — CIPROFLOXACIN HCL 500 MG PO TABS: 500.0000 mg | ORAL_TABLET | Freq: Two times a day (BID) | ORAL | Status: AC

## 2011-06-13 NOTE — Progress Notes (Signed)
  Subjective:    Patient ID: Adam Garner, male    DOB: 10/05/1921, 78 y.o.   MRN: [id number]  Back Pain  Patient is here for followup of manipulation. Patient started doing physical therapy and has noticed is remarkable difference in his balance.patient is also working out with a physical therapist and trainer 7 days a week. Patient is to continue this and continue to improve. Patient still is having a little bit of aches and pains after exercising and increase activity but otherwise feeling well. Patient states she has not felt this good in years.   Hypertension Blood pressure at home: 130s to 150s systolic Blood pressure today: 158/82 on recheck Taking Meds: No except for his prostate medications that can lower his blood pressure Side effects: No ROS: Denies headache visual changes nausea, vomiting, chest pain or abdominal pain or shortness of breath.  Patient though is also complaining of increasing of urination. Patient does have a history of benign prostate hypertrophy, and needed to self catheter from time to time. Patient states recently he is also had some increased burning with urination.  Review of Systems  Musculoskeletal: Positive for back pain.   Patient denies fever, chills, nausea, vomiting, diarrhea, constipation Past medical history, social, surgical and family history all reviewed.      Objective:   Physical Exam  Gen: NAD healthy CV: RRR 1/6 SEM right upper sternal border Pul: CTAB Abd" BS+ NT ND  OMT Findings: Cervical: Neutral Thoracic: T7  Extended rotated and side bent right Lumbar:L2 rotated and side bent right Sacrum:left on left with anterior illium on right Pt does ambulate with  much more balance increase stride length Assessment & Plan:

## 2011-06-13 NOTE — Patient Instructions (Signed)
It is great to see you. You are true inspiration keep it up. X line I'm giving you an antibiotic called ciprofloxacin. Take one pill twice a day for the next 3 weeks. He continued to have pain with urination after week of the antibiotics please try to increase her Flomax again. Adam Garner been wonderful to work with over the course of this last year.

## 2011-06-14 ENCOUNTER — Telehealth: Payer: Self-pay | Admitting: Family Medicine

## 2011-06-14 DIAGNOSIS — N419 Inflammatory disease of prostate, unspecified: Secondary | ICD-10-CM | POA: Insufficient documentation

## 2011-06-14 NOTE — Telephone Encounter (Signed)
Call from [facility]. Their e-prescribing was down yesterday and were unable to receive rx for cipro.  Gave verbal rx as described in recent note (Cipro 500mg  BID for 3 weeks, dispense number 42).

## 2011-06-14 NOTE — Assessment & Plan Note (Signed)
Patient's history of symptoms as well as still self cathing at one time daily can increase his risk of infection. No systemic findings at this time. We will treat though for presumed prostatitis. Ciprofloxacin given, discussed potential side effects. Patient will followup in 3 weeks.

## 2011-06-14 NOTE — Assessment & Plan Note (Signed)
Decision today to treat with OMT was based on Physical Exam  After verbal consent patient was treated with muscle energy and articular techniques in cervical and thoracic areas  Patient tolerated the procedure well with improvement in symptoms  Patient given exercises, stretches and lifestyle modifications  See medications in patient instructions if given  Patient will follow up in 3-4 weeks

## 2011-06-14 NOTE — Assessment & Plan Note (Signed)
Still very labile with blood pressure. At patient's age would try to have a goal blood pressure of less than 170 systolic.

## 2011-08-14 ENCOUNTER — Other Ambulatory Visit: Payer: Self-pay | Admitting: Family Medicine

## 2011-08-14 DIAGNOSIS — Z87898 Personal history of other specified conditions: Secondary | ICD-10-CM

## 2011-08-14 MED ORDER — FINASTERIDE 5 MG PO TABS: 5.0000 mg | ORAL_TABLET | Freq: Every day | ORAL | Status: DC

## 2011-09-18 ENCOUNTER — Telehealth: Payer: Self-pay | Admitting: Family Medicine

## 2011-09-18 NOTE — Telephone Encounter (Signed)
Called patient back and discussed his urination, much more free at this time. Patient is having some incontinence now.  Told him to back down on flomax at this time and see how he does.  Denies fever, chills, nausea vomiting abdominal pain, dysuria, chest pain, shortness of breath dyspnea on exertion or numbness in extremities Patient told though now that I am with sports medicine he should discuss this with his new PCP.

## 2011-09-18 NOTE — Telephone Encounter (Signed)
Message copied by Judi Saa on Thu Sep 18, 2011 10:03 AM ------      Message from: Claiborne Billings      Created: Tue Sep 16, 2011 12:49 PM      Contact: [phone]       Requesting a call - Says you will know what it is in regards to.

## 2012-01-25 DIAGNOSIS — R32 Unspecified urinary incontinence: Secondary | ICD-10-CM | POA: Diagnosis present

## 2012-01-25 DIAGNOSIS — N390 Urinary tract infection, site not specified: Principal | ICD-10-CM | POA: Diagnosis present

## 2012-01-25 DIAGNOSIS — M810 Age-related osteoporosis without current pathological fracture: Secondary | ICD-10-CM | POA: Diagnosis present

## 2012-01-25 DIAGNOSIS — G20 Parkinson's disease: Secondary | ICD-10-CM | POA: Diagnosis present

## 2012-01-25 DIAGNOSIS — E785 Hyperlipidemia, unspecified: Secondary | ICD-10-CM | POA: Diagnosis present

## 2012-01-25 DIAGNOSIS — D649 Anemia, unspecified: Secondary | ICD-10-CM | POA: Diagnosis present

## 2012-01-25 DIAGNOSIS — F0390 Unspecified dementia without behavioral disturbance: Secondary | ICD-10-CM | POA: Diagnosis present

## 2012-01-25 DIAGNOSIS — G20A1 Parkinson's disease without dyskinesia, without mention of fluctuations: Secondary | ICD-10-CM | POA: Diagnosis present

## 2012-01-25 DIAGNOSIS — H3530 Unspecified macular degeneration: Secondary | ICD-10-CM | POA: Diagnosis present

## 2012-01-25 DIAGNOSIS — I10 Essential (primary) hypertension: Secondary | ICD-10-CM | POA: Diagnosis present

## 2012-01-25 DIAGNOSIS — I4891 Unspecified atrial fibrillation: Secondary | ICD-10-CM | POA: Diagnosis present

## 2012-01-25 DIAGNOSIS — N400 Enlarged prostate without lower urinary tract symptoms: Secondary | ICD-10-CM | POA: Diagnosis present

## 2012-01-30 ENCOUNTER — Other Ambulatory Visit: Payer: Self-pay | Admitting: Family Medicine

## 2012-02-17 ENCOUNTER — Ambulatory Visit (INDEPENDENT_AMBULATORY_CARE_PROVIDER_SITE_OTHER): Payer: PRIVATE HEALTH INSURANCE | Admitting: Family Medicine

## 2012-02-17 VITALS — BP 132/78 | Ht 67.0 in | Wt 183.0 lb

## 2012-02-17 DIAGNOSIS — R2689 Other abnormalities of gait and mobility: Secondary | ICD-10-CM

## 2012-02-17 DIAGNOSIS — R29818 Other symptoms and signs involving the nervous system: Secondary | ICD-10-CM

## 2012-02-17 DIAGNOSIS — M999 Biomechanical lesion, unspecified: Secondary | ICD-10-CM

## 2012-02-17 NOTE — Assessment & Plan Note (Signed)
Decision today to treat with OMT was based on Physical Exam  After verbal consent patient was treated with ME and articular techniques in thoracolumbar areas  Patient tolerated the procedure well with improvement in symptoms  Patient given exercises, stretches and lifestyle modifications  See medications in patient instructions if given  Patient will follow up in 4-6 weeks

## 2012-02-17 NOTE — Assessment & Plan Note (Signed)
Discussed again with patient that this is likely secondary to deconditioning and worsening because he is not working out on a regular basis. Patient will start doing that and declined formal physical therapy today. If manipulation does help him today we will have him followup again in 4-6 weeks.

## 2012-02-17 NOTE — Progress Notes (Signed)
  Subjective:    Patient ID: Adam Garner, male    DOB: July 03, 1921, 79 y.o.   MRN: [id number]  Back Pain  Patient is here for followup of manipulation. It has been quite some time since we've seen him. Patient has been doing very well and is not complaining of significant pain. Patient did have one recent fall secondary to his balance problems. Patient has stopped doing his exercises and does feel somewhat weaker which he thinks is likely the cause. Patient denies any radiation or any numbness or tingling that is out of the ordinary. Patient denies any bowel or bladder incontinence.   Patient though is also complaining of increasing of urination. Patient does have a history of benign prostate hypertrophy, and needed to self catheter from time to time. Patient states recently he is also had some increased burning with urination.  Review of Systems  Musculoskeletal: Positive for back pain.   Patient denies fever, chills, nausea, vomiting, diarrhea, constipation Past medical history, social, surgical and family history all reviewed.      Objective:   Physical Exam Blood pressure 132/78, height 5\' 7"  (1.702 m), weight 183 lb (83.008 kg). General: No apparent distress OMT Findings: Cervical: Neutral Thoracic: T7  Extended rotated and side bent right Lumbar:L2 rotated and side bent right Sacrum:left on left with anterior illium on right Patient's ambulation is much more of a wide gait again. Patient does have lack of core strength. Assessment & Plan:

## 2012-03-11 ENCOUNTER — Other Ambulatory Visit: Payer: Self-pay | Admitting: *Deleted

## 2012-03-11 MED ORDER — TAMSULOSIN HCL 0.4 MG PO CAPS: 0.4000 mg | ORAL_CAPSULE | ORAL | Status: DC

## 2012-03-23 ENCOUNTER — Ambulatory Visit (INDEPENDENT_AMBULATORY_CARE_PROVIDER_SITE_OTHER): Payer: PRIVATE HEALTH INSURANCE | Admitting: Family Medicine

## 2012-03-23 VITALS — BP 140/90 | Ht 67.0 in | Wt 183.0 lb

## 2012-03-23 DIAGNOSIS — R5381 Other malaise: Secondary | ICD-10-CM

## 2012-03-23 DIAGNOSIS — M999 Biomechanical lesion, unspecified: Secondary | ICD-10-CM

## 2012-03-23 DIAGNOSIS — M9981 Other biomechanical lesions of cervical region: Secondary | ICD-10-CM

## 2012-03-23 NOTE — Assessment & Plan Note (Signed)
Decision today to treat with OMT was based on Physical Exam  After verbal consent patient was treated with ME, FPR techniques in thoracolumbar areas  Patient tolerated the procedure well with improvement in symptoms  Patient given exercises, stretches and lifestyle modifications  See medications in patient instructions if given  Patient will follow up in 6-8 weeks

## 2012-03-23 NOTE — Progress Notes (Signed)
  Subjective:    Patient ID: Adam Garner, male    DOB: [birth year], 79 y.o.   MRN: [id number]  HPIPatient is here for followup of manipulation. She is to have some low back pain but states he's doing fairly well. The patient does not doing as well as he used to do. Patient was going to physical therapythe week and doing exercises the other days. Patient states the for the last couple months he has not been doing this on a regular basis and not at all during the last month. Patient is noticed as been growing more and more fatigued. The patient denies any change in medications or anything change in his diet. Patient states that he does sleep more than usual and states that he does get short of breath with light activity. Patient denies any chest pain.    Review of Systems Patient denies fever, chills, nausea, vomiting, diarrhea, constipation Past medical history, social, surgical and family history all reviewed.      Objective:   Physical Exam Blood pressure 140/90, height 5\' 7"  (1.702 m), weight 183 lb (83.008 kg). Gen: NAD elderly male CV: RRR 2/6 SEM right upper sternal border Pul: CTAB Abd" BS+ NT ND Walks with a broad base stance.  OMT Findings: Cervical: Neutral Thoracic: T7  Extended rotated and side bent right Lumbar:L2 rotated and side bent right Sacrum:left on left with anterior illium on right  Assessment & Plan:

## 2012-03-23 NOTE — Assessment & Plan Note (Signed)
Patient is having some fatigue. Patient is an 79 year old male who is known for years. I think his differential is fairly brought in. Patient likely will need to see another specialist that I feel he should touch base with his new primary care provider. I will send patient primary care provider Dr. Russ Halo a note suggesting different potential treatment options. Patient may need labs including a CBC and a complete metabolic panel in addition to this patient may also need further evaluation by either sleep study, pulmonary or cardiovascular.  Thyroid B12 and vitamin D levels may not be a bad idea either. Patient didn't reflect and menisci medical attention.

## 2012-03-29 ENCOUNTER — Ambulatory Visit: Payer: PRIVATE HEALTH INSURANCE | Admitting: Family Medicine

## 2012-04-01 ENCOUNTER — Ambulatory Visit (INDEPENDENT_AMBULATORY_CARE_PROVIDER_SITE_OTHER): Payer: PRIVATE HEALTH INSURANCE | Admitting: Family Medicine

## 2012-04-01 ENCOUNTER — Encounter: Payer: Self-pay | Admitting: Family Medicine

## 2012-04-01 VITALS — BP 160/88 | HR 57 | Temp 98.1°F | Ht 67.0 in | Wt 187.0 lb

## 2012-04-01 DIAGNOSIS — R2689 Other abnormalities of gait and mobility: Secondary | ICD-10-CM

## 2012-04-01 DIAGNOSIS — Z87898 Personal history of other specified conditions: Secondary | ICD-10-CM

## 2012-04-01 DIAGNOSIS — R29818 Other symptoms and signs involving the nervous system: Secondary | ICD-10-CM

## 2012-04-01 DIAGNOSIS — R0989 Other specified symptoms and signs involving the circulatory and respiratory systems: Secondary | ICD-10-CM

## 2012-04-01 DIAGNOSIS — R0609 Other forms of dyspnea: Secondary | ICD-10-CM | POA: Insufficient documentation

## 2012-04-01 DIAGNOSIS — D649 Anemia, unspecified: Secondary | ICD-10-CM

## 2012-04-01 DIAGNOSIS — M810 Age-related osteoporosis without current pathological fracture: Secondary | ICD-10-CM

## 2012-04-01 DIAGNOSIS — I10 Essential (primary) hypertension: Secondary | ICD-10-CM

## 2012-04-01 NOTE — Patient Instructions (Addendum)
Thank you for coming in today Please follow up with our doctors in the geriatric clinic Please go see [facility] on April 10th at 10:30am. I think a lot of your symptoms are related to your heart. Please make sure to discuss starting a cholesterol lowering medicine. Please come back to see me July and we will repeat your labwork to look for any other abnormalities Have a great day.

## 2012-04-01 NOTE — Assessment & Plan Note (Addendum)
Likely w/ exercised induced ischemia secondary to CAD.  Pt w/ multiple risks factors of CAD Will refer to [facility] for recommended workup including stress test and echo.  No obvious evidence for CHF

## 2012-04-01 NOTE — Progress Notes (Signed)
Adam Garner is a 79 y.o. male who presents to [facility] today for fatigue  BPH: no longer going to urology for BPH. No longer I/O Cathing. Taking Flomax and finasteride. No dysuria or urinary retention. Frequent urination but no urinary retention and would prefer frequent urination over cath at this time. Not interested in surgery.  Balance: no longer taking normal steps. Now shuffling. Wore contacts for years adn year w/o any issues. Changed to bifocals which has been a difficult transition for the pt. Has h/o macular degeneration and cataracts.   SOB: developed over the past 66mo ago. SOB up one flight of stairs, 1/4 of a block. Previously able to walk 1 mile.  No claudication, feels heavy. Denies CP, lightheadness, syncope, palpitations, LE edema, orthopnea. Previous cardiac workup by cards 5-6 years ago.   The following portions of the patient's history were reviewed and updated as appropriate: allergies, current medications, past medical history, family and social history, and problem list.  Patient is a nonsmoker.   Past Medical History  Diagnosis Date  . Osteoporosis   . BPH (benign prostatic hyperplasia)   . Hyperlipidemia   . BPH (benign prostatic hyperplasia)   . Anemia     ROS as above otherwise neg.    Medications reviewed. Current Outpatient Prescriptions  Medication Sig Dispense Refill  . Calcium Carbonate-Vit D-Min 600-400 MG-UNIT CHEW Chew 1 tablet by mouth 2 (two) times daily.  60 each  3  . cyanocobalamin ([facility] VITAMIN B12) 1000 MCG tablet Take 1 tablet (1,000 mcg total) by mouth daily.  100 tablet  2  . finasteride (PROSCAR) 5 MG tablet TAKE 1 TABLET DAILY.  90 tablet  3  . tamsulosin (FLOMAX) 0.4 MG CAPS Take 1 capsule (0.4 mg total) by mouth daily after supper.  180 capsule  1   No current facility-administered medications for this visit.    Exam: BP 160/88  Pulse 57  Temp(Src) 98.1 F (36.7 C) (Oral)  Ht 5\' 7"  (1.702 m)  Wt 187 lb (84.823 kg)  BMI 29.28 kg/m2   SpO2 94% Gen: Well NAD, Elderly HEENT: EOMI,  MMM, PERRL Lungs: CTABL Nl WOB Heart: RRR no MRG no carotid bruit Abd: NABS, NT, ND Exts: Non edematous BL  LE, warm and well perfused.  Neuro: CN 2-12 intact, cerebellar fxn nml, proprioception nml. Rhomberg nml. Shuffling gate but able to ambulate w/ narrow gate when asked.  [facility] exam: L 20/100 R 20/90 B 20/60  Ambulatory sat 94-95%  No results found for this or any previous visit (from the past 72 hour(s)).

## 2012-04-05 NOTE — Assessment & Plan Note (Signed)
BP elevated today but nml at last 2 appts.  Will allow permissive htn given age (JNC 8). Precautions reviewed.

## 2012-04-05 NOTE — Assessment & Plan Note (Signed)
Continue w/ Finasteride and flomax.  Previous PSA 5 w/in nml range given age of 79yo Pt not wanting surgery. Continue current therapy.

## 2012-04-05 NOTE — Assessment & Plan Note (Signed)
Continue w/ Ca/VitD therapy. Previous Vit D level elevated to 102 in January

## 2012-04-05 NOTE — Assessment & Plan Note (Signed)
Likely mechanical falls secondary to deconditioning and difficulty w/ vision. Pt to wear glasses more.  Inability to exercise secondary to DOE will prevent pt from getting his strength back.  Will refer to Geriatric clinic for further evaulation

## 2012-04-05 NOTE — Assessment & Plan Note (Signed)
No likely contributing to fatigue Normal Hgb/Hct on last blood draw in January

## 2012-04-19 ENCOUNTER — Encounter: Payer: Self-pay | Admitting: *Deleted

## 2012-04-19 ENCOUNTER — Encounter: Payer: Self-pay | Admitting: Cardiovascular Disease

## 2012-04-21 ENCOUNTER — Institutional Professional Consult (permissible substitution): Payer: PRIVATE HEALTH INSURANCE | Admitting: Cardiovascular Disease

## 2012-04-22 ENCOUNTER — Encounter: Payer: Self-pay | Admitting: Cardiovascular Disease

## 2012-04-22 ENCOUNTER — Ambulatory Visit (INDEPENDENT_AMBULATORY_CARE_PROVIDER_SITE_OTHER)
Admission: RE | Admit: 2012-04-22 | Discharge: 2012-04-22 | Disposition: A | Discharge status: Home / Self Care | Payer: PRIVATE HEALTH INSURANCE | Source: Ambulatory Visit | Attending: Cardiovascular Disease | Admitting: Cardiovascular Disease

## 2012-04-22 ENCOUNTER — Ambulatory Visit: Payer: PRIVATE HEALTH INSURANCE

## 2012-04-22 ENCOUNTER — Ambulatory Visit (INDEPENDENT_AMBULATORY_CARE_PROVIDER_SITE_OTHER): Payer: PRIVATE HEALTH INSURANCE | Admitting: Cardiovascular Disease

## 2012-04-22 VITALS — BP 164/98 | HR 70 | Ht 67.0 in | Wt 187.0 lb

## 2012-04-22 DIAGNOSIS — R0609 Other forms of dyspnea: Secondary | ICD-10-CM

## 2012-04-22 DIAGNOSIS — R29818 Other symptoms and signs involving the nervous system: Secondary | ICD-10-CM

## 2012-04-22 DIAGNOSIS — R2689 Other abnormalities of gait and mobility: Secondary | ICD-10-CM

## 2012-04-22 DIAGNOSIS — R0989 Other specified symptoms and signs involving the circulatory and respiratory systems: Secondary | ICD-10-CM

## 2012-04-22 DIAGNOSIS — E785 Hyperlipidemia, unspecified: Secondary | ICD-10-CM

## 2012-04-22 DIAGNOSIS — G20 Parkinson's disease: Secondary | ICD-10-CM

## 2012-04-22 DIAGNOSIS — R0600 Dyspnea, unspecified: Secondary | ICD-10-CM

## 2012-04-22 DIAGNOSIS — I4891 Unspecified atrial fibrillation: Secondary | ICD-10-CM

## 2012-04-22 DIAGNOSIS — I10 Essential (primary) hypertension: Secondary | ICD-10-CM

## 2012-04-22 DIAGNOSIS — G20A1 Parkinson's disease without dyskinesia, without mention of fluctuations: Secondary | ICD-10-CM

## 2012-04-22 NOTE — Progress Notes (Addendum)
Patient ID: Adam Garner, male   DOB: 10-25-1921, 79 y.o.   MRN: [id number] 79 yo [facility] dyspnea.  Patient and his wife appear to have some dementia.  Therefore history was difficult and rambling. Seems to be most concerned about prostate and poor balance.  Does have worsening dyspnea over the past year. No history of CAD, CHF or chronic lung disease.  No PND, orthopnea or edema.  He is concerned that poor ciruclation to his head is causing imbalance.  He does not have any chest pain or palpitations. But he is in atrial fibrillation with irregular pulse which was not mentioned on FP visit.  Gait has been worsening over a year "shuffles" everywhere and is concerned about falling Given imbalacne and gait instability with age he is not a candidate for anticoagulation  Reviewed old records.  Had normal stress myovue in 2009.  EF 59% Only exercised for 3:00 minuyrd 5 years ago  ROS: Denies fever, malais, weight loss, blurry vision, decreased visual acuity, cough, sputum, SOB, hemoptysis, pleuritic pain, palpitaitons, heartburn, abdominal pain, melena, lower extremity edema, claudication, or rash.  All other systems reviewed and negative   General: Affect appropriate Healthy:  appears stated age HEENT: normal Neck supple with no adenopathy JVP normal no bruits no thyromegaly Lungs clear with no wheezing and good diaphragmatic motion Heart:  S1/S2 no murmur,rub, gallop or click PMI normal Abdomen: benighn, BS positve, no tenderness, no AAA no bruit.  No HSM or HJR Distal pulses intact with no bruits No edema Neuro non-focal  Flunks finger to nose and has shuffling gait.  Skin warm and dry No muscular weakness  Medications Current Outpatient Prescriptions  Medication Sig Dispense Refill  . Calcium Carbonate-Vit D-Min 600-400 MG-UNIT CHEW Chew 1 tablet by mouth 2 (two) times daily.  60 each  3  . cyanocobalamin ([facility]S VITAMIN B12) 1000 MCG tablet Take 1 tablet  (1,000 mcg total) by mouth daily.  100 tablet  2  . finasteride (PROSCAR) 5 MG tablet TAKE 1 TABLET DAILY.  90 tablet  3  . tamsulosin (FLOMAX) 0.4 MG CAPS Take 1 capsule (0.4 mg total) by mouth daily after supper.  180 capsule  1   No current facility-administered medications for this visit.    Allergies Penicillins and Sulfonamide derivatives  Family History: Family History  Problem Relation Age of Onset  . Heart disease Mother   . Heart disease Father     Social History: History   Social History  . Marital Status: Widowed    Spouse Name: N/A    Number of Children: N/A  . Years of Education: N/A   Occupational History  . Not on file.   Social History Main Topics  . Smoking status: Never Smoker   . Smokeless tobacco: Not on file  . Alcohol Use: Yes     Comment: social  . Drug Use: No  . Sexually Active: Not on file   Other Topics Concern  . Not on file   Social History Narrative  . No narrative on file    Electrocardiogram:  Today afib rate 70 nonspecific ST/T wave changes occasional aberantly conducted beats  Afib is new since 02/28/09 ECG showed SR rate 60 and normal And 10/14/2006 SR rate 51 and normal  Assessment and Plan

## 2012-04-22 NOTE — Assessment & Plan Note (Signed)
This is not a circulation, rhythm or cardiac issue. I suspect he has parkinsons or a cerebellar abnormality. Refer to [facility] neuro.  May benefit from PT/OT.

## 2012-04-22 NOTE — Assessment & Plan Note (Signed)
Well controlled.  Continue current medications and low sodium Dash type diet.    

## 2012-04-22 NOTE — Assessment & Plan Note (Signed)
Not clear how long he has been in it.  In my mind clearly not an anticoagulant candidate.  ASA  Rate control drugs pending echo

## 2012-04-22 NOTE — Assessment & Plan Note (Signed)
Cholesterol is at goal.  Continue current dose of statin and diet Rx.  No myalgias or side effects.  F/U  LFT's in 6 months. No results found for this basename: LDLCALC             

## 2012-04-22 NOTE — Patient Instructions (Addendum)
Your physician recommends that you schedule a follow-up appointment in: AS NEEDED  Your physician recommends that you continue on your current medications as directed. Please refer to the Current Medication list given to you today.  Your physician has requested that you have an echocardiogram. Echocardiography is a painless test that uses sound waves to create images of your heart. It provides your doctor with information about the size and shape of your heart and how well your heart's chambers and valves are working. This procedure takes approximately one hour. There are no restrictions for this procedure.   A chest x-ray takes a picture of the organs and structures inside the chest, including the heart, lungs, and blood vessels. This test can show several things, including, whether the heart is enlarges; whether fluid is building up in the lungs; and whether pacemaker / defibrillator leads are still in place.  You have been referred to NEUROLOGY  DX  R/O   PARKISON,S  DR TAT

## 2012-04-22 NOTE — Assessment & Plan Note (Signed)
Etiology not clear.  Needs CXR.  Echo to assess RV and LV function.  Afib rate seems ok.  After echo will consider holter to assess average HR and see if cardizem or beta blocker needed.

## 2012-04-23 ENCOUNTER — Ambulatory Visit (HOSPITAL_COMMUNITY): Payer: PRIVATE HEALTH INSURANCE | Attending: Cardiology | Admitting: Radiology

## 2012-04-23 DIAGNOSIS — R0989 Other specified symptoms and signs involving the circulatory and respiratory systems: Secondary | ICD-10-CM

## 2012-04-23 DIAGNOSIS — R0600 Dyspnea, unspecified: Secondary | ICD-10-CM

## 2012-04-23 DIAGNOSIS — R0609 Other forms of dyspnea: Secondary | ICD-10-CM | POA: Insufficient documentation

## 2012-04-23 NOTE — Progress Notes (Signed)
Echocardiogram performed.  

## 2012-04-26 ENCOUNTER — Telehealth: Payer: Self-pay | Admitting: Cardiovascular Disease

## 2012-04-26 NOTE — Telephone Encounter (Signed)
TEST RESULTS GIVEN  .Zack Seal

## 2012-04-26 NOTE — Telephone Encounter (Signed)
New problem   Pts friend jean calling for pts results

## 2012-05-10 ENCOUNTER — Ambulatory Visit: Payer: PRIVATE HEALTH INSURANCE | Admitting: Neurology

## 2012-05-10 ENCOUNTER — Encounter: Payer: Self-pay | Admitting: Home Health Services

## 2012-05-10 DIAGNOSIS — Z9181 History of falling: Secondary | ICD-10-CM | POA: Insufficient documentation

## 2012-06-08 ENCOUNTER — Encounter: Payer: Self-pay | Admitting: Neurology

## 2012-06-08 ENCOUNTER — Ambulatory Visit (INDEPENDENT_AMBULATORY_CARE_PROVIDER_SITE_OTHER): Payer: PRIVATE HEALTH INSURANCE | Admitting: Neurology

## 2012-06-08 VITALS — BP 140/80 | HR 78 | Temp 98.1°F | Ht 67.0 in | Wt 191.1 lb

## 2012-06-08 DIAGNOSIS — G20 Parkinson's disease: Secondary | ICD-10-CM

## 2012-06-08 DIAGNOSIS — G629 Polyneuropathy, unspecified: Secondary | ICD-10-CM

## 2012-06-08 DIAGNOSIS — G609 Hereditary and idiopathic neuropathy, unspecified: Secondary | ICD-10-CM

## 2012-06-08 DIAGNOSIS — G20A1 Parkinson's disease without dyskinesia, without mention of fluctuations: Secondary | ICD-10-CM

## 2012-06-08 DIAGNOSIS — G214 Vascular parkinsonism: Secondary | ICD-10-CM

## 2012-06-08 LAB — RPR

## 2012-06-08 NOTE — Progress Notes (Signed)
Adam Garner was seen today in the movement disorders clinic for neurologic consultation at the request of Dr. Eden Emms.  His PCP is MERRELL, DAVID, MD.  The consultation is for the evaluation of ataxia. The pt is accompanied by his live in girlfriend who supplements the hx.   The patient reports that he has had balance trouble for 2 years.  He initially related it to the change from bifocal contacts to bifocal glasses, and he no longer wears the glasses to walk.  He reports that he becomes tired after walking and then he will become off balance.  He denies paresthesias of the feet or pain in the legs. He states that it is a combination of generalized fatigue after walking and also being off balance.  He was exercising on a bike and then quit and deteriorated but has recently restarted.  He thinks that the recent exercise has led to an improvement.  He has not had neuroimaging.  He was recently dx with afib but found not to be a candidate for coumadin d/t fall risk.   He no longer drives.   Specific Symptoms:  Tremor: no Voice: no changes Sleep: sleeps well  Vivid Dreams:  no  Acting out dreams:  no Wet Pillows: no Postural symptoms:  no  Falls?  no (rare, none in 2-3 months and no serious injuries) Bradykinesia symptoms: difficulty getting out of a chair Loss of smell:  yes Loss of taste:  yes Urinary Incontinence:  yes, wears depends during day Difficulty Swallowing:  no Handwriting, micrographia: yes Trouble with ADL's:  yes (just slow)  Trouble buttoning clothing: no Depression:  no Memory changes:  no Hallucinations:  no  visual distortions: no N/V:  no Lightheaded:  no  Syncope: no Diplopia:  no Dyskinesia:  no  PREVIOUS MEDICATIONS: none to date  ALLERGIES:   Allergies  Allergen Reactions  . Penicillins   . Sulfonamide Derivatives     CURRENT MEDICATIONS:  Current Outpatient Prescriptions on File Prior to Visit  Medication Sig Dispense Refill  . Calcium Carbonate-Vit  D-Min 600-400 MG-UNIT CHEW Chew 1 tablet by mouth 2 (two) times daily.  60 each  3  . finasteride (PROSCAR) 5 MG tablet TAKE 1 TABLET DAILY.  90 tablet  3  . tamsulosin (FLOMAX) 0.4 MG CAPS Take 1 capsule (0.4 mg total) by mouth daily after supper.  180 capsule  1  . cyanocobalamin ([facility]S VITAMIN B12) 1000 MCG tablet Take 1 tablet (1,000 mcg total) by mouth daily.  100 tablet  2   No current facility-administered medications on file prior to visit.    PAST MEDICAL HISTORY:   Past Medical History  Diagnosis Date  . Osteoporosis   . BPH (benign prostatic hyperplasia)   . Hyperlipidemia   . BPH (benign prostatic hyperplasia)   . Anemia   . Macular degeneration   . Atrial fibrillation     PAST SURGICAL HISTORY:   Past Surgical History  Procedure Laterality Date  . Tonsilectomy/adenoidectomy with myringotomy      SOCIAL HISTORY:   History   Social History  . Marital Status: Widowed    Spouse Name: N/A    Number of Children: N/A  . Years of Education: N/A   Occupational History  . retired     Art gallery manager   Social History Main Topics  . Smoking status: Never Smoker   . Smokeless tobacco: Not on file  . Alcohol Use: Yes     Comment: none  . Drug  Use: No  . Sexually Active: Not on file   Other Topics Concern  . Not on file   Social History Narrative  . No narrative on file    FAMILY HISTORY:   Family Status  Relation Status Death Age  . Mother Deceased 40    CHF  . Father Deceased     CAD  . Brother Alive     CA, colon  . Child Deceased     sleep apnea    ROS:  A complete 10 system review of systems was obtained and was unremarkable apart from what is mentioned above.  PHYSICAL EXAMINATION:    VITALS:   Filed Vitals:   06/08/12 0941  BP: 140/80  Pulse: 78  Temp: 98.1 F (36.7 C)  Height: 5\' 7"  (1.702 m)  Weight: 191 lb 1.6 oz (86.682 kg)    GEN:  The patient appears stated age and is in NAD. HEENT:  Normocephalic, atraumatic.  The mucous membranes  are moist. The superficial temporal arteries are without ropiness or tenderness. CV:  Irreg Irreg Lungs:  CTAB Neck/HEME:  There are no carotid bruits bilaterally.  Neurological examination:  Orientation: The patient scored a 19/30 on the MoCA done today. Cranial nerves: There is good facial symmetry. Pupils are equal round and reactive to light bilaterally. Fundoscopic exam reveals clear margins bilaterally. Extraocular muscles are intact. The visual fields are full to confrontational testing. The speech is fluent and clear. Soft palate rises symmetrically and there is no tongue deviation. Hearing is intact to conversational tone. Sensation: Sensation is intact to light and pinprick throughout (facial, trunk, extremities).  Pinprick is decreased in a stocking distribution.   Vibration is markedly decreased in a distal fashion. There is no extinction with double simultaneous stimulation. There is no sensory dermatomal level identified. Motor: Strength is 5/5 in the bilateral upper and lower extremities.   Shoulder shrug is equal and symmetric.  There is no pronator drift. Deep tendon reflexes: Deep tendon reflexes are 2/4 at the bilateral biceps, triceps, brachioradialis, patella and achilles. Plantar responses are downgoing bilaterally.  Movement examination: Tone: There is nl tone in the bilateral upper extremities.  The tone in the lower extremities is nl.  Abnormal movements: no resting tremor.  Minimal postural tremor b/l. Coordination:  There is no decremation with RAM's, Including alternating supination and pronation of the forearm, hand opening and closing, finger taps, heel taps and toe taps. Gait and Station: The patient has difficulty arising out of a deep-seated chair without the use of the hands.  He makes 2 attempts but falls back in the chair both times.  The patient's stride length is decreased with mild wide base.  He is stooped.         LABS:  Lab Results  Component Value Date    WBC 10.9* 02/13/2011   HGB 15.2 02/13/2011   HCT 44.0 02/13/2011   MCV 89.8 02/13/2011   PLT 280 02/13/2011   Lab Results  Component Value Date   TSH 1.357 02/13/2011   Lab Results  Component Value Date   VITAMINB12 1081* 02/13/2011   No results found for this basename: FOLATE   No results found for this basename: RPR   No results found for this basename: SPEP, UPEP     Chemistry      Component Value Date/Time   NA 139 02/13/2011 1017   K 4.3 02/13/2011 1017   CL 105 02/13/2011 1017   CO2 25 02/13/2011 1017  BUN 26* 02/13/2011 1017   CREATININE 1.05 02/13/2011 1017   CREATININE 1.0 02/23/2009 0919      Component Value Date/Time   CALCIUM 9.7 02/13/2011 1017   ALKPHOS 58 02/13/2011 1017   AST 23 02/13/2011 1017   ALT 15 02/13/2011 1017   BILITOT 0.5 02/13/2011 1017     No results found for this basename: HGBA1C     ASSESSMENT/PLAN:   1 gait instability.  I think that this is definitely multifactorial.    -I see almost certain evidence of an idiopathic peripheral neuropathy.  We will check some lab work in that regard.  -We talked about safety associated with peripheral neuropathy.  We talked about the value of safe exercise.  I talked to him about the importance of physical therapy.  His girlfriend is opposed to that and thinks that acupuncture and Silver sneakers is the same/comparible to physical therapy.  I explained to him that physical therapy is going to help him with gait and balance.  I encouraged him to go as well as continue the [employer] program.  I will send a referral.  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  I do not think he has idiopathic Parkinson's disease.  I want to do an MRI of the brain that he has refused.  He will let me know if he changes his mind. 2.  F/u in 3 months, sooner should new neuro issues arise.

## 2012-06-08 NOTE — Patient Instructions (Addendum)
Follow up in 3 months.  We will refer you for Physical Therapy and they wil call you to schedule.

## 2012-06-09 ENCOUNTER — Other Ambulatory Visit: Payer: Self-pay | Admitting: Neurology

## 2012-06-09 DIAGNOSIS — R269 Unspecified abnormalities of gait and mobility: Secondary | ICD-10-CM

## 2012-06-09 DIAGNOSIS — R2689 Other abnormalities of gait and mobility: Secondary | ICD-10-CM

## 2012-06-10 LAB — PROTEIN ELECTROPHORESIS, URINE REFLEX

## 2012-06-11 LAB — IMMUNOFIXATION ELECTROPHORESIS
IgA: 314 mg/dL (ref 68–379)
IgM, Serum: 39 mg/dL — ABNORMAL LOW (ref 41–251)

## 2012-06-11 LAB — PROTEIN ELECTROPHORESIS, SERUM
Albumin ELP: 61 % (ref 55.8–66.1)
Alpha-2-Globulin: 9.5 % (ref 7.1–11.8)
Beta Globulin: 6.6 % (ref 4.7–7.2)
Total Protein, Serum Electrophoresis: 7.3 g/dL (ref 6.0–8.3)

## 2012-06-29 ENCOUNTER — Telehealth: Payer: Self-pay | Admitting: Neurology

## 2012-06-29 NOTE — Telephone Encounter (Signed)
Pt.notified

## 2012-06-29 NOTE — Telephone Encounter (Signed)
The majority were great.  The one will need to be rechecked in the fall as it was borderline (spep).  The SPEP just deals with types of proteins in the blood.  At this point, nothing to worry about.

## 2012-06-29 NOTE — Telephone Encounter (Signed)
Patient is requesting last lab results 

## 2012-09-08 ENCOUNTER — Ambulatory Visit: Payer: PRIVATE HEALTH INSURANCE | Admitting: Neurology

## 2012-10-20 ENCOUNTER — Telehealth: Payer: Self-pay | Admitting: Family Medicine

## 2012-10-20 DIAGNOSIS — G629 Polyneuropathy, unspecified: Secondary | ICD-10-CM

## 2012-10-20 NOTE — Telephone Encounter (Signed)
Pt called and would like a referral to [facility]. JW

## 2012-10-20 NOTE — Telephone Encounter (Signed)
Pt has previously seen [facility].  Please advise. Shantee Hayne,CMA

## 2012-10-21 NOTE — Telephone Encounter (Signed)
FIne with me if his insurance will allow. I don't think I made the initial referral, so this shouldn't be a problem.

## 2012-10-22 NOTE — Assessment & Plan Note (Signed)
Recent Neuro eval.  Pt would like second opinion Referral for [facility] per pt request

## 2012-10-22 NOTE — Telephone Encounter (Signed)
Can you please put this referral in?  We will call to inform of referral so pt can call and schedule his own appt per his request.  Adam Garner

## 2012-10-22 NOTE — Telephone Encounter (Signed)
Carney Bern called back again, inquiring about referral status. Referral to be faxed to [phone] ATTN: Diane. Please call Patient once referral has been sent [phone]

## 2012-10-24 ENCOUNTER — Encounter (HOSPITAL_COMMUNITY): Payer: Self-pay | Admitting: Emergency Medicine

## 2012-10-24 ENCOUNTER — Emergency Department (HOSPITAL_COMMUNITY)
Admission: EM | Admit: 2012-10-24 | Discharge: 2012-10-24 | Disposition: A | Discharge status: Home / Self Care | Payer: Medicare Other | Source: Home / Self Care | Attending: Emergency Medicine | Admitting: Emergency Medicine

## 2012-10-24 ENCOUNTER — Emergency Department (HOSPITAL_COMMUNITY): Payer: Medicare Other

## 2012-10-24 DIAGNOSIS — Z7982 Long term (current) use of aspirin: Secondary | ICD-10-CM | POA: Insufficient documentation

## 2012-10-24 DIAGNOSIS — Z79899 Other long term (current) drug therapy: Secondary | ICD-10-CM | POA: Insufficient documentation

## 2012-10-24 DIAGNOSIS — F29 Unspecified psychosis not due to a substance or known physiological condition: Secondary | ICD-10-CM | POA: Insufficient documentation

## 2012-10-24 DIAGNOSIS — G20 Parkinson's disease: Secondary | ICD-10-CM | POA: Insufficient documentation

## 2012-10-24 DIAGNOSIS — N400 Enlarged prostate without lower urinary tract symptoms: Secondary | ICD-10-CM | POA: Insufficient documentation

## 2012-10-24 DIAGNOSIS — Z8639 Personal history of other endocrine, nutritional and metabolic disease: Secondary | ICD-10-CM | POA: Insufficient documentation

## 2012-10-24 DIAGNOSIS — Z8739 Personal history of other diseases of the musculoskeletal system and connective tissue: Secondary | ICD-10-CM | POA: Insufficient documentation

## 2012-10-24 DIAGNOSIS — N390 Urinary tract infection, site not specified: Secondary | ICD-10-CM

## 2012-10-24 DIAGNOSIS — Z880 Allergy status to penicillin: Secondary | ICD-10-CM | POA: Insufficient documentation

## 2012-10-24 DIAGNOSIS — Z8679 Personal history of other diseases of the circulatory system: Secondary | ICD-10-CM | POA: Insufficient documentation

## 2012-10-24 DIAGNOSIS — R4182 Altered mental status, unspecified: Secondary | ICD-10-CM | POA: Diagnosis not present

## 2012-10-24 DIAGNOSIS — G20A1 Parkinson's disease without dyskinesia, without mention of fluctuations: Secondary | ICD-10-CM | POA: Insufficient documentation

## 2012-10-24 DIAGNOSIS — Z862 Personal history of diseases of the blood and blood-forming organs and certain disorders involving the immune mechanism: Secondary | ICD-10-CM | POA: Insufficient documentation

## 2012-10-24 HISTORY — DX: Parkinson's disease: G20

## 2012-10-24 HISTORY — DX: Parkinsonism, unspecified: G20.C

## 2012-10-24 LAB — COMPREHENSIVE METABOLIC PANEL
ALT: 37 U/L (ref 0–53)
Alkaline Phosphatase: 62 U/L (ref 39–117)
BUN: 27 mg/dL — ABNORMAL HIGH (ref 6–23)
CO2: 24 mEq/L (ref 19–32)
Chloride: 104 mEq/L (ref 96–112)
Creatinine, Ser: 1.02 mg/dL (ref 0.50–1.35)
GFR calc Af Amer: 74 mL/min — ABNORMAL LOW (ref >90)
GFR calc non Af Amer: 63 mL/min — ABNORMAL LOW (ref >90)
Glucose, Bld: 117 mg/dL — ABNORMAL HIGH (ref 70–99)
Potassium: 4.2 mEq/L (ref 3.5–5.1)
Total Bilirubin: 0.8 mg/dL (ref 0.3–1.2)

## 2012-10-24 LAB — CBC WITH DIFFERENTIAL/PLATELET
Basophils Relative: 0 % (ref 0–1)
Eosinophils Absolute: 0 10*3/uL (ref 0.0–0.7)
HCT: 46.6 % (ref 39.0–52.0)
Hemoglobin: 16.6 g/dL (ref 13.0–17.0)
Lymphs Abs: 1.4 10*3/uL (ref 0.7–4.0)
MCH: 32.2 pg (ref 26.0–34.0)
MCHC: 35.6 g/dL (ref 30.0–36.0)
Monocytes Absolute: 0.6 10*3/uL (ref 0.1–1.0)
Monocytes Relative: 6 % (ref 3–12)
Neutro Abs: 8.5 10*3/uL — ABNORMAL HIGH (ref 1.7–7.7)
Neutrophils Relative %: 80 % — ABNORMAL HIGH (ref 43–77)
RBC: 5.15 MIL/uL (ref 4.22–5.81)

## 2012-10-24 LAB — URINE MICROSCOPIC-ADD ON

## 2012-10-24 LAB — URINALYSIS, ROUTINE W REFLEX MICROSCOPIC
Bilirubin Urine: NEGATIVE
Ketones, ur: NEGATIVE mg/dL
Nitrite: POSITIVE — AB
Protein, ur: NEGATIVE mg/dL
Specific Gravity, Urine: 1.02 (ref 1.005–1.030)
Urobilinogen, UA: 0.2 mg/dL (ref 0.0–1.0)

## 2012-10-24 LAB — TROPONIN I: Troponin I: <0.3 ng/mL (ref <0.30)

## 2012-10-24 MED ORDER — LORAZEPAM 2 MG/ML IJ SOLN
1.0000 mg | Freq: Once | INTRAMUSCULAR | Status: AC
Start: 2012-10-24 — End: 2012-10-24
  Administered 2012-10-24: 1 mg via INTRAVENOUS
  Filled 2012-10-24: qty 1

## 2012-10-24 MED ORDER — SODIUM CHLORIDE 0.9 % IV SOLN
INTRAVENOUS | Status: DC
  Administered 2012-10-24: 09:00:00 via INTRAVENOUS

## 2012-10-24 MED ORDER — DEXTROSE 5 % IV SOLN
1.0000 g | Freq: Once | INTRAVENOUS | Status: AC
  Administered 2012-10-24: 13:00:00 via INTRAVENOUS
  Filled 2012-10-24 (×2): qty 10

## 2012-10-24 MED ORDER — CEPHALEXIN 500 MG PO CAPS: 500.0000 mg | ORAL_CAPSULE | Freq: Four times a day (QID) | ORAL | Status: DC

## 2012-10-24 NOTE — ED Notes (Signed)
Patient to MRI.

## 2012-10-24 NOTE — ED Provider Notes (Signed)
CSN: [id number]     Arrival date & time 10/24/12  0732 History   First MD Initiated Contact with Patient 10/24/12 0745     Chief Complaint  Patient presents with  . Altered Mental Status   (Consider location/radiation/quality/duration/timing/severity/associated sxs/prior Treatment) Patient is a 79 y.o. male presenting with altered mental status. The history is provided by the patient and a friend.  Altered Mental Status  patient here with periods of confusion x3 days according to his girlfriend. No reported fever or chills. No head trauma. No vomiting or diarrhea. Nothing makes her symptoms better or worse. He scheduled to see a neurologist in 3 days for evaluation of parkinsonism. No new medications. Patient's confusion is manifested as asking repetitive questions. No prior history of same. No urinary symptoms.  Past Medical History  Diagnosis Date  . Osteoporosis   . BPH (benign prostatic hyperplasia)   . Hyperlipidemia   . BPH (benign prostatic hyperplasia)   . Anemia   . Macular degeneration   . Atrial fibrillation   . Parkinsonism    Past Surgical History  Procedure Laterality Date  . Tonsilectomy/adenoidectomy with myringotomy     Family History  Problem Relation Age of Onset  . Heart disease Mother   . Heart disease Father    History  Substance Use Topics  . Smoking status: Never Smoker   . Smokeless tobacco: Not on file  . Alcohol Use: No     Comment: none    Review of Systems  All other systems reviewed and are negative.    Allergies  Penicillins and Sulfonamide derivatives  Home Medications   Current Outpatient Rx  Name  Route  Sig  Dispense  Refill  . aspirin 81 MG tablet   Oral   Take 81 mg by mouth daily.         . Calcium Carbonate-Vit D-Min 600-400 MG-UNIT CHEW   Oral   Chew 1 tablet by mouth 2 (two) times daily.   60 each   3   . EXPIRED: cyanocobalamin ([facility] VITAMIN B12) 1000 MCG tablet   Oral   Take 1 tablet (1,000 mcg total) by  mouth daily.   100 tablet   2   . finasteride (PROSCAR) 5 MG tablet      TAKE 1 TABLET DAILY.   90 tablet   3   . fish oil-omega-3 fatty acids 1000 MG capsule   Oral   Take 2 g by mouth daily.         . tamsulosin (FLOMAX) 0.4 MG CAPS   Oral   Take 1 capsule (0.4 mg total) by mouth daily after supper.   180 capsule   1    BP 180/85  Pulse 74  Temp(Src) 97.7 F (36.5 C) (Oral)  Resp 20  Wt 183 lb (83.008 kg)  BMI 28.66 kg/m2  SpO2 96% Physical Exam  Nursing note and vitals reviewed. Constitutional: He is oriented to person, place, and time. He appears well-developed and well-nourished.  Non-toxic appearance. No distress.  HENT:  Head: Normocephalic and atraumatic.  Eyes: Conjunctivae, EOM and lids are normal. Pupils are equal, round, and reactive to light.  Neck: Normal range of motion. Neck supple. No tracheal deviation present. No mass present.  Cardiovascular: Normal rate, regular rhythm and normal heart sounds.  Exam reveals no gallop.   No murmur heard. Pulmonary/Chest: Effort normal and breath sounds normal. No stridor. No respiratory distress. He has no decreased breath sounds. He has no wheezes. He has  no rhonchi. He has no rales.  Abdominal: Soft. Normal appearance and bowel sounds are normal. He exhibits no distension. There is no tenderness. There is no rebound and no CVA tenderness.  Musculoskeletal: Normal range of motion. He exhibits no edema and no tenderness.  Neurological: He is alert and oriented to person, place, and time. He has normal strength. No cranial nerve deficit or sensory deficit. GCS eye subscore is 4. GCS verbal subscore is 5. GCS motor subscore is 6.  Skin: Skin is warm and dry. No abrasion and no rash noted.  Psychiatric: His speech is normal. His affect is blunt. He is not actively hallucinating. Thought content is not paranoid and not delusional. He exhibits normal remote memory. He is inattentive.    ED Course  Procedures (including  critical care time) Labs Review Labs Reviewed  CBC WITH DIFFERENTIAL  COMPREHENSIVE METABOLIC PANEL  TROPONIN I  URINALYSIS, ROUTINE W REFLEX MICROSCOPIC   Imaging Review No results found.  EKG Interpretation   None       MDM  No diagnosis found.  Date: 10/24/2012  Rate: 78  Rhythm: atrial fibrillation  QRS Axis: left  Intervals: normal  ST/T Wave abnormalities: nonspecific ST/T changes  Conduction Disutrbances:none  Narrative Interpretation:   Old EKG Reviewed: none available   12:28 PM Patient had negative MRI and head CT here. Urinalysis shows infection. Patient is code stroke. Suspect that his symptoms are from his UTI. He has followup with neurology artery scheduled for next week. He was given Rocephin IV piggyback and will be placed on Keflex. He is alert and oriented x4. Has had some perseverations occasionally. Stable for discharge   Toy Baker, MD 10/24/12 1229

## 2012-10-24 NOTE — ED Notes (Signed)
PT coming from home with confusion and AMS x 3 days. Per family pt has weakness and repeatedly asking questions about the  Phone and lights. Pt alert and oriented x 1  But  Not giving straight forward answers. Pt has facial symmetry, no droop.

## 2012-10-24 NOTE — ED Notes (Signed)
Patient returned from MRI.

## 2012-10-26 ENCOUNTER — Encounter: Payer: Self-pay | Admitting: Neurology

## 2012-10-26 ENCOUNTER — Inpatient Hospital Stay (HOSPITAL_COMMUNITY)
Admission: EM | Admit: 2012-10-26 | Discharge: 2012-11-02 | DRG: 690 | Disposition: A | Discharge status: Skilled Nursing Facility | Payer: Medicare Other | Attending: Family Medicine | Admitting: Family Medicine

## 2012-10-26 ENCOUNTER — Encounter (HOSPITAL_COMMUNITY): Payer: Self-pay | Admitting: Emergency Medicine

## 2012-10-26 ENCOUNTER — Emergency Department (HOSPITAL_COMMUNITY): Payer: Medicare Other

## 2012-10-26 ENCOUNTER — Ambulatory Visit (INDEPENDENT_AMBULATORY_CARE_PROVIDER_SITE_OTHER): Payer: Medicare Other | Admitting: Neurology

## 2012-10-26 VITALS — BP 193/92 | HR 84 | Temp 97.6°F | Ht 67.0 in | Wt 188.0 lb

## 2012-10-26 DIAGNOSIS — D649 Anemia, unspecified: Secondary | ICD-10-CM

## 2012-10-26 DIAGNOSIS — G629 Polyneuropathy, unspecified: Secondary | ICD-10-CM

## 2012-10-26 DIAGNOSIS — G609 Hereditary and idiopathic neuropathy, unspecified: Secondary | ICD-10-CM

## 2012-10-26 DIAGNOSIS — I10 Essential (primary) hypertension: Secondary | ICD-10-CM | POA: Diagnosis present

## 2012-10-26 DIAGNOSIS — R269 Unspecified abnormalities of gait and mobility: Secondary | ICD-10-CM

## 2012-10-26 DIAGNOSIS — Z9181 History of falling: Secondary | ICD-10-CM

## 2012-10-26 DIAGNOSIS — Z87898 Personal history of other specified conditions: Secondary | ICD-10-CM | POA: Diagnosis present

## 2012-10-26 DIAGNOSIS — M4120 Other idiopathic scoliosis, site unspecified: Secondary | ICD-10-CM

## 2012-10-26 DIAGNOSIS — N390 Urinary tract infection, site not specified: Secondary | ICD-10-CM

## 2012-10-26 DIAGNOSIS — I4891 Unspecified atrial fibrillation: Secondary | ICD-10-CM | POA: Diagnosis present

## 2012-10-26 DIAGNOSIS — R930 Abnormal findings on diagnostic imaging of skull and head, not elsewhere classified: Secondary | ICD-10-CM

## 2012-10-26 DIAGNOSIS — R9082 White matter disease, unspecified: Secondary | ICD-10-CM

## 2012-10-26 DIAGNOSIS — G9340 Encephalopathy, unspecified: Secondary | ICD-10-CM | POA: Diagnosis present

## 2012-10-26 DIAGNOSIS — F0391 Unspecified dementia with behavioral disturbance: Secondary | ICD-10-CM

## 2012-10-26 DIAGNOSIS — R4182 Altered mental status, unspecified: Secondary | ICD-10-CM

## 2012-10-26 DIAGNOSIS — R2681 Unsteadiness on feet: Secondary | ICD-10-CM

## 2012-10-26 LAB — URINE CULTURE: Colony Count: >=100000

## 2012-10-26 LAB — POCT I-STAT, CHEM 8
Creatinine, Ser: 1.2 mg/dL (ref 0.50–1.35)
Hemoglobin: 15.6 g/dL (ref 13.0–17.0)
Sodium: 142 mEq/L (ref 135–145)
TCO2: 23 mmol/L (ref 0–100)

## 2012-10-26 LAB — CBC WITH DIFFERENTIAL/PLATELET
Eosinophils Relative: 1 % (ref 0–5)
HCT: 43.8 % (ref 39.0–52.0)
Lymphocytes Relative: 19 % (ref 12–46)
Lymphs Abs: 2 10*3/uL (ref 0.7–4.0)
MCHC: 36.1 g/dL — ABNORMAL HIGH (ref 30.0–36.0)
MCV: 89.6 fL (ref 78.0–100.0)
Monocytes Absolute: 1 10*3/uL (ref 0.1–1.0)
Monocytes Relative: 9 % (ref 3–12)
RBC: 4.89 MIL/uL (ref 4.22–5.81)
WBC: 10.5 10*3/uL (ref 4.0–10.5)

## 2012-10-26 LAB — CG4 I-STAT (LACTIC ACID): Lactic Acid, Venous: 1.14 mmol/L (ref 0.5–2.2)

## 2012-10-26 MED ORDER — DEXTROSE 5 % IV SOLN
1.0000 g | Freq: Once | INTRAVENOUS | Status: AC
  Administered 2012-10-26: 1 g via INTRAVENOUS
  Filled 2012-10-26: qty 10

## 2012-10-26 NOTE — ED Provider Notes (Signed)
CSN: [id number]     Arrival date & time 10/26/12  2004 History   First MD Initiated Contact with Patient 10/26/12 2109     Chief Complaint  Patient presents with  . Altered Mental Status   (Consider location/radiation/quality/duration/timing/severity/associated sxs/prior Treatment) HPI  79 year old male with history of benign prostatic hyperplasia, atrial fibrillation not on blood thinner medication, recent UTI diagnosed 2 days ago presents for evaluation of confusion. History obtained through friend who is at bedside.  Per friend, who takes care of patient at home, patient has been confused, talking to himself, acting strange ongoing for about 5 days. He has been seen by a neurologist and has had head CT and MRI performed that shows no evidence suggestive of stroke, or other acute pathology. He was in the ER recently for the same complaint, UA shows evidence of a UTI, patient was given Rocephin in the ER and was discharge with Keflex. Patient has been taking medication as prescribed. However he is more confused than previously and there has been no improvement of his symptoms. History difficult to obtain from patient however he denies having any fever, headache, chest pain, shortness of breath, abdominal pain, dysuria, numbness or weakness. No other recent medication changes.  Level V caveat applied due to AMS likely from delirium.  Past Medical History  Diagnosis Date  . Osteoporosis   . BPH (benign prostatic hyperplasia)   . Hyperlipidemia   . BPH (benign prostatic hyperplasia)   . Anemia   . Macular degeneration   . Atrial fibrillation   . Parkinsonism    Past Surgical History  Procedure Laterality Date  . Tonsilectomy/adenoidectomy with myringotomy     Family History  Problem Relation Age of Onset  . Heart disease Mother   . Heart disease Father    History  Substance Use Topics  . Smoking status: Never Smoker   . Smokeless tobacco: Not on file  . Alcohol Use: No   Comment: none    Review of Systems  Unable to perform ROS: Mental status change    Allergies  Penicillins and Sulfonamide derivatives  Home Medications   Current Outpatient Rx  Name  Route  Sig  Dispense  Refill  . aspirin 81 MG tablet   Oral   Take 81 mg by mouth daily.         . cephALEXin (KEFLEX) 500 MG capsule   Oral   Take 1 capsule (500 mg total) by mouth 4 (four) times daily.   28 capsule   0   . finasteride (PROSCAR) 5 MG tablet   Oral   Take 5 mg by mouth daily.         . fish oil-omega-3 fatty acids 1000 MG capsule   Oral   Take 1 g by mouth 2 (two) times daily.          . Multiple Vitamin (MULTIVITAMIN WITH MINERALS) TABS tablet   Oral   Take 1 tablet by mouth daily.         . tamsulosin (FLOMAX) 0.4 MG CAPS capsule   Oral   Take 0.8 mg by mouth daily after supper.          There were no vitals taken for this visit. Physical Exam  Nursing note and vitals reviewed. Constitutional: He appears well-developed and well-nourished. No distress.  Awake, alert, nontoxic appearance  HENT:  Head: Atraumatic.  Mouth/Throat: Oropharynx is clear and moist.  Eyes: Conjunctivae are normal. Right eye exhibits no discharge. Left eye  exhibits no discharge.  Neck: Normal range of motion. Neck supple.  Cardiovascular:  Irregularly irregular  Pulmonary/Chest: Effort normal. No respiratory distress. He exhibits no tenderness.  Abdominal: Soft. There is no tenderness. There is no rebound.  Musculoskeletal: He exhibits no tenderness.  ROM appears intact, no obvious focal weakness  Neurological: He is alert. He has normal strength. No cranial nerve deficit or sensory deficit. He displays a negative Romberg sign. Gait normal. GCS eye subscore is 4. GCS verbal subscore is 5. GCS motor subscore is 6.  Alert and oriented only to self.  Skin: Skin is warm and dry. No rash noted.  Psychiatric: He has a normal mood and affect.    ED Course  Procedures (including  critical care time)  Patient with UTI, urine culture shows greater than 100,000 colonies of Escherichia coli is penicillin sensitive.  Continues to exhibits delirium although he is afebrile and nontoxic in appearance.  BP is high, 193/92.  Plan to start pt on Rocephin, and will check basic labs.  Will have pt admitted for further care.  Care discussed with attending.    12:27 AM UA shows evidence of urinary tract infection. Patient has normal WBC, electrolytes or reassuring. He continues to be delirious. Will consult on admission.  12:34 AM Pt will be transferred to [name] for further management of his UTI.  Pt stable for transfer.    Labs Review Labs Reviewed  CBC WITH DIFFERENTIAL - Abnormal; Notable for the following:    MCHC 36.1 (*)    All other components within normal limits  URINALYSIS, ROUTINE W REFLEX MICROSCOPIC - Abnormal; Notable for the following:    APPearance CLOUDY (*)    Leukocytes, UA MODERATE (*)    All other components within normal limits  URINE MICROSCOPIC-ADD ON - Abnormal; Notable for the following:    Crystals CA OXALATE CRYSTALS (*)    All other components within normal limits  POCT I-STAT, CHEM 8 - Abnormal; Notable for the following:    Glucose, Bld 118 (*)    All other components within normal limits  CG4 I-STAT (LACTIC ACID)   Imaging Review Dg Chest 2 View  10/26/2012   *RADIOLOGY REPORT*  Clinical Data: Altered mental status,  CHEST - 2 VIEW  Comparison: Chest radiograph October 24, 2012  Findings: Cardiac silhouette appears moderately enlarged, even with consideration to this low inspiratory examination with crowded vasculature markings.  No pleural effusions.  No focal consolidations.  No pneumothorax.  Multiple EKG lines overlay the patient and could obscure underlying subtle pathology. Mild degenerate change of thoracolumbar spine. Soft tissue planes are not suspicious.  IMPRESSION: Stable appearance of chest:  Moderate cardiomegaly, low  inspiratory examination.   Original Report Authenticated By: Awilda Metro    EKG Interpretation     Ventricular Rate:  87 PR Interval:    QRS Duration: 85 QT Interval:  375 QTC Calculation: 452 R Axis:   5 Text Interpretation:  Atrial fibrillation Borderline repol abnormality, diffuse leads No significant change was found            MDM   1. UTI (lower urinary tract infection)    BP 191/105  Pulse 82  Temp(Src) 97.3 F (36.3 C) (Oral)  Resp 21  SpO2 95%  I have reviewed nursing notes and vital signs. I personally reviewed the imaging tests through PACS system  I reviewed available ER/hospitalization records thought the EMR    Fayrene Helper, [place] 10/27/12 1610

## 2012-10-26 NOTE — ED Notes (Signed)
Per wife, pt has had confusion for 3 days.  Was seen Friday for confusion, had CT and ruled dx of UTI.  Started antibiotics.  Family also states was seen by neurology.  Pt very confused on arrival and talking about lights.  Wife upset on arrival and stressing pt to be seen immediately.  Per wife, when asked if confusion has worsened or changed in last 3 days, wife states no.  No change in confusion.  Taking antibiotics as prescribed.

## 2012-10-26 NOTE — Patient Instructions (Signed)
I think overall you are doing fairly well but I do want to suggest a few things today:  Remember to drink plenty of fluid, eat healthy meals and do not skip any meals. Try to eat protein with a every meal and eat a healthy snack such as fruit or nuts in between meals. Try to keep a regular sleep-wake schedule and try to exercise daily, particularly in the form of walking, 20-30 minutes a day, if you can. Use your walker at ALL TIMES.   Engage in social activities in your community and with your family and try to keep up with current events by reading the newspaper or watching the news.   As far as your medications are concerned, I would like to suggest completing your antibiotics.   As far as diagnostic testing: call us around the 23 rd of this month to report back regarding his mental state. If he has unchanged confusion, we will consider a EEG, which is a brain wave test. We will do some blood work today.  I would like to see you back as needed. Please call us with any interim questions, concerns, problems, updates or refill requests.  Please also call us for any test results so we can go over those with you on the phone. Brett Canales is my clinical assistant and will answer any of your questions and relay your messages to me and also relay most of my messages to you.  Our phone number is [phone]. We also have an after hours call service for urgent matters and there is a physician on-call for urgent questions. For any emergencies you know to call 911 or go to the nearest emergency room.

## 2012-10-26 NOTE — Progress Notes (Signed)
Subjective:    Patient ID: Adam Garner is a 79 y.o. male.  HPI [name], MD, PhD [facility] [address] [address] [place], [place] [address]  Dear Dr. Konrad Dolores,   I saw your patient, Adam Garner, upon your kind request in my neurologic clinic today for initial consultation of his neuropathy and gait disorder, concern for parkinsonism (as suspected by his cardiologist). He is here also for AMS x 5. The patient is accompanied by his GF, [name], today. As you know, [name] is a very pleasant 79 year old right-handed gentleman with an underlying medical history of atrial fibrillation (not on coumadin d/t fall risk), osteoporosis, BPH, hyperlipidemia, anemia and macular degeneration, who has had problems with his gait for the past 2+ years. He saw Dr. Arbutus Leas at [facility] on 06/08/12 and was advised that he most likely has a multifactorial gait disorder as well as some evidence of vascular parkinsonism. She suggested a brain MRI which he had on 10/24/12 and I reviewed the report: Moderate small vessel disease type changes, moderate global atrophy. Ventricular prominence may be related to atrophy. Difficult to completely exclude a mild component of hydrocephalus. Aqueduct is patent. Dr. Arbutus Leas also suggested blood work. I reviewed those test results from 06/08/12: He had normal glucose, normal folate, non-reactive RPR, normal SPEP, and mild abnormality in his immune fixation electrophoresis, and it was suggested to repeat the test in 6 months. He was in the ED on 10/24/12 for AMS x 3 days and was diagnosed with a UTI. He had a CTH on 10/24/12 as well and I reviewed the report: Global atrophy. Prominent small vessel disease type changes without CT evidence of large acute infarct. He was given Rocephin IV and was placed on Keflex. He has improved, but has evidence of hallucinations, confusion, and perseveration. He is not able to provide reliable Hx and his Hx is  provided by his GF.  He went to a acupuncturist, who told him, that his frontal lobes were weak.  He has a walker with 2 wheels, but does not use a walker nor a cane d/t "pride", per GF. He snores mildly, and no breathing pauses in his sleep are reported. He had a son, who died at 9 yo, with a Hx of OSA and bipolar.   His Past Medical History Is Significant For: Past Medical History  Diagnosis Date  . Osteoporosis   . BPH (benign prostatic hyperplasia)   . Hyperlipidemia   . BPH (benign prostatic hyperplasia)   . Anemia   . Macular degeneration   . Atrial fibrillation   . Parkinsonism     His Past Surgical History Is Significant For: Past Surgical History  Procedure Laterality Date  . Tonsilectomy/adenoidectomy with myringotomy      His Family History Is Significant For: Family History  Problem Relation Age of Onset  . Heart disease Mother   . Heart disease Father     His Social History Is Significant For: History   Social History  . Marital Status: Widowed    Spouse Name: N/A    Number of Children: N/A  . Years of Education: N/A   Occupational History  . retired     Art gallery manager   Social History Main Topics  . Smoking status: Never Smoker   . Smokeless tobacco: None  . Alcohol Use: No     Comment: none  . Drug Use: No  . Sexual Activity: None   Other Topics Concern  .  None   Social History Narrative  . None    His Allergies Are:  Allergies  Allergen Reactions  . Penicillins     unknown  . Sulfonamide Derivatives     unknown  :   His Current Medications Are:  Outpatient Encounter Prescriptions as of 10/26/2012  Medication Sig Dispense Refill  . aspirin 81 MG tablet Take 81 mg by mouth daily.      . cephALEXin (KEFLEX) 500 MG capsule Take 1 capsule (500 mg total) by mouth 4 (four) times daily.  28 capsule  0  . finasteride (PROSCAR) 5 MG tablet Take 5 mg by mouth daily.      . fish oil-omega-3 fatty acids 1000 MG capsule Take 1 g by mouth 2 (two)  times daily.       Marland Kitchen FLUZONE HIGH-DOSE injection Inject 0.5 mLs into the muscle.       . Multiple Vitamin (MULTIVITAMIN WITH MINERALS) TABS tablet Take 1 tablet by mouth daily.      . tamsulosin (FLOMAX) 0.4 MG CAPS capsule Take 0.8 mg by mouth daily after supper.       No facility-administered encounter medications on file as of 10/26/2012.   Review of Systems:  Out of a complete 14 point review of systems, all are reviewed and negative with the exception of these symptoms as listed below:  Review of Systems  Respiratory: Positive for shortness of breath.        Snoring  Cardiovascular: Positive for leg swelling.  Gastrointestinal:       Incontinence  Endocrine: Positive for cold intolerance and polydipsia.  Genitourinary: Positive for difficulty urinating.  Neurological:       Memory loss  Hematological: Bruises/bleeds easily.  Psychiatric/Behavioral: Positive for confusion.    Objective:  Neurologic Exam  Physical Exam Physical Examination:   Filed Vitals:   10/26/12 0954  BP: 193/92  Pulse: 84  Temp: 97.6 F (36.4 C)    General Examination: The patient is a very pleasant 79 y.o. male in no acute distress. He is calm and cooperative with the exam, but is talking out of context, is not able to answer questions appropriately and has to be repeatedly redirected. He has circumlocutional responses. He is well groomed and situated in a chair.   HEENT: Normocephalic, atraumatic, pupils are equal, round and reactive to light and accommodation. Funduscopic exam is normal with sharp disc margins noted. Extraocular tracking shows mild saccadic breakdown without nystagmus noted. Hearing is fairly intact. Tympanic membranes are clear bilaterally. Face is symmetric with no facial masking and normal facial sensation. There is no lip, neck or jaw tremor. Neck is mildly rigid with intact passive ROM. There are no carotid bruits on auscultation. Oropharynx exam reveals moderate mouth  dryness. No significant airway crowding is noted. Mallampati is class III. Tongue protrudes centrally and palate elevates symmetrically.    Chest: is clear to auscultation without wheezing, rhonchi or crackles noted.  Heart: sounds are regular and normal without murmurs, rubs or gallops noted.   Abdomen: is soft, non-tender and non-distended with normal bowel sounds appreciated on auscultation.  Extremities: There is 2+ pitting edema in the R ankle, 1+ in the L ankle.    Skin: is warm and dry with no trophic changes noted. Age-related changes are noted on the skin. Multiple small, mild bruises of varying ages are noted on the forearms.  Musculoskeletal: exam reveals no obvious joint deformities, tenderness or joint swelling or erythema.    Neurologically:  Mental status: The patient is awake and alert, paying poor attention. He is unable to provide the history. His GF provides the Hx. He is oriented to: person and season. His memory, attention, language and knowledge are impaired. There is no aphasia, agnosia, apraxia or anomia. There is a mild degree of bradyphrenia. Speech is not hypophonic with no dysarthria noted. There is mild hoarseness.   Cranial nerves are as described above under HEENT exam. In addition, shoulder shrug is normal with equal shoulder height noted.  Motor exam: Normal bulk, and strength for age is noted. Tone is not rigid with absence of cogwheeling. There is overall mild bradykinesia. There is no drift or rebound. There is no tremor. Reflexes are 1+ in the upper extremities and trace in the knees and absent in the ankles. Toes are downgoing bilaterally. Fine motor skills: Finger taps, hand movements, and rapid alternating patting are mildly impaired bilaterally. Foot taps and foot agility are mildly impaired bilaterally.   Cerebellar testing shows no dysmetria or intention tremor on finger to nose testing. Heel to shin is not possible. There is no truncal ataxia.    Sensory exam is intact to light touch, pinprick, vibration, and temperature sense in the upper and lower extremities, with the exception of decrease .   Gait, station and balance: He stands up from the seated position with mild difficulty and needs to push up with one hand. No veering to one side is noted, he has mild kyphoscoliosis. No leaning to one side. Posture is moderately stooped. Stance is wide-based. He turns in multiple steps. Tandem walk is not possible. Balance is moderately impaired.   Assessment and Plan:   In summary, NILE PRISK is a very pleasant 79 y.o.-year old male with a history of A fib, osteoporosis, BPH, hyperlipidemia, anemia and macular degeneration, who presents with altered mental status of about 5 days duration. This is most likely due to his UTI, in the context of advanced age, with moderate WM changes and moderate atrophy. He has a gait disorder, which is most likely multifactorial in nature, d/t ageing, brain volume loss and cerebral atherosclerosis, as well as mild neuropathy and abnormal posture.   I had a long chat with the patient and his caretaker about my findings and the diagnosis of AMS, the prognosis and treatment options. We will have to give him more time to recuperate and it may take a few weeks for him to get back to baseline. With ageing we have less "brain reserve" and he may or may not get back fully to his previous baseline. I will do some additional blood work today and we will consider an EEG down the Road. We talked about medical treatments and non-pharmacological approaches. We talked about maintaining a healthy lifestyle in general. I encouraged the patient to eat healthy, exercise daily and keep well hydrated, to keep a scheduled bedtime and wake time routine, to not skip any meals and eat healthy snacks in between meals and to have protein with every meal. He is advised to use his 2 wheeled walker at all times. I did not suggest any new  medications. I can probably see him back on an as-needed basis. I've asked Carney Bern to call us towards the end of next week to report the status on his confusion. If he is no better we will proceed with an EEG. We will also be calling her with his blood test results. If he is making slow improvement we will just have to  give him more time. I answered all her questions today and suggested routine followup with you as scheduled. Thank you very much for allowing me to participate in the care of this nice patient. If I can be of any further assistance to you please do not hesitate to call me at [phone].  Sincerely,   [name], MD, PhD

## 2012-10-27 DIAGNOSIS — N390 Urinary tract infection, site not specified: Secondary | ICD-10-CM | POA: Diagnosis present

## 2012-10-27 DIAGNOSIS — R4182 Altered mental status, unspecified: Secondary | ICD-10-CM | POA: Diagnosis present

## 2012-10-27 DIAGNOSIS — G9340 Encephalopathy, unspecified: Secondary | ICD-10-CM | POA: Diagnosis present

## 2012-10-27 DIAGNOSIS — F0390 Unspecified dementia without behavioral disturbance: Secondary | ICD-10-CM | POA: Diagnosis present

## 2012-10-27 DIAGNOSIS — M810 Age-related osteoporosis without current pathological fracture: Secondary | ICD-10-CM | POA: Diagnosis present

## 2012-10-27 DIAGNOSIS — R32 Unspecified urinary incontinence: Secondary | ICD-10-CM | POA: Diagnosis present

## 2012-10-27 DIAGNOSIS — H3530 Unspecified macular degeneration: Secondary | ICD-10-CM | POA: Diagnosis present

## 2012-10-27 DIAGNOSIS — E785 Hyperlipidemia, unspecified: Secondary | ICD-10-CM | POA: Diagnosis present

## 2012-10-27 DIAGNOSIS — G20 Parkinson's disease: Secondary | ICD-10-CM | POA: Diagnosis present

## 2012-10-27 DIAGNOSIS — N400 Enlarged prostate without lower urinary tract symptoms: Secondary | ICD-10-CM | POA: Diagnosis present

## 2012-10-27 DIAGNOSIS — I10 Essential (primary) hypertension: Secondary | ICD-10-CM | POA: Diagnosis present

## 2012-10-27 DIAGNOSIS — I4891 Unspecified atrial fibrillation: Secondary | ICD-10-CM | POA: Diagnosis present

## 2012-10-27 DIAGNOSIS — D649 Anemia, unspecified: Secondary | ICD-10-CM | POA: Diagnosis present

## 2012-10-27 LAB — URINALYSIS, ROUTINE W REFLEX MICROSCOPIC
Bilirubin Urine: NEGATIVE
Glucose, UA: NEGATIVE mg/dL
Ketones, ur: NEGATIVE mg/dL
Urobilinogen, UA: 0.2 mg/dL (ref 0.0–1.0)
pH: 5.5 (ref 5.0–8.0)

## 2012-10-27 LAB — COMPREHENSIVE METABOLIC PANEL
ALT: 31 IU/L (ref 0–44)
Albumin/Globulin Ratio: 2 (ref 1.1–2.5)
Albumin: 4.7 g/dL (ref 3.5–4.7)
Alkaline Phosphatase: 64 IU/L (ref 39–117)
BUN/Creatinine Ratio: 15 (ref 10–22)
BUN: 16 mg/dL (ref 8–27)
Calcium: 9.8 mg/dL (ref 8.6–10.2)
GFR calc Af Amer: 71 mL/min/{1.73_m2} (ref >59)
GFR calc non Af Amer: 62 mL/min/{1.73_m2} (ref >59)
Glucose: 104 mg/dL — ABNORMAL HIGH (ref 65–99)
Potassium: 4.2 mmol/L (ref 3.5–5.2)
Total Bilirubin: 0.7 mg/dL (ref 0.0–1.2)
Total Protein: 7 g/dL (ref 6.0–8.5)

## 2012-10-27 LAB — CBC
HCT: 43.2 % (ref 39.0–52.0)
Hemoglobin: 15.6 g/dL (ref 13.0–17.0)
MCHC: 36.1 g/dL — ABNORMAL HIGH (ref 30.0–36.0)
RBC: 4.82 MIL/uL (ref 4.22–5.81)
WBC: 9.6 10*3/uL (ref 4.0–10.5)

## 2012-10-27 LAB — CBC WITH DIFFERENTIAL
Basos: 0 %
HCT: 47.4 % (ref 37.5–51.0)
Hemoglobin: 16.5 g/dL (ref 12.6–17.7)
Lymphs: 21 %
MCHC: 34.8 g/dL (ref 31.5–35.7)
MCV: 92 fL (ref 79–97)
Monocytes: 8 %
Neutrophils Absolute: 6.3 10*3/uL (ref 1.4–7.0)
RBC: 5.17 x10E6/uL (ref 4.14–5.80)
WBC: 9 10*3/uL (ref 3.4–10.8)

## 2012-10-27 LAB — URINE MICROSCOPIC-ADD ON

## 2012-10-27 LAB — BASIC METABOLIC PANEL
Calcium: 9.3 mg/dL (ref 8.4–10.5)
Creatinine, Ser: 0.95 mg/dL (ref 0.50–1.35)
GFR calc Af Amer: 84 mL/min — ABNORMAL LOW (ref >90)
GFR calc non Af Amer: 72 mL/min — ABNORMAL LOW (ref >90)

## 2012-10-27 LAB — C-REACTIVE PROTEIN: CRP: 2.8 mg/L (ref 0.0–4.9)

## 2012-10-27 LAB — PRO B NATRIURETIC PEPTIDE: Pro B Natriuretic peptide (BNP): 1710 pg/mL — ABNORMAL HIGH (ref 0–450)

## 2012-10-27 MED ORDER — SODIUM CHLORIDE 0.9 % IJ SOLN: 3.0000 mL | INTRAMUSCULAR | Status: DC | PRN

## 2012-10-27 MED ORDER — HYDRALAZINE HCL 20 MG/ML IJ SOLN: 5.0000 mg | INTRAMUSCULAR | Status: DC | PRN

## 2012-10-27 MED ORDER — TAMSULOSIN HCL 0.4 MG PO CAPS
0.8000 mg | ORAL_CAPSULE | Freq: Every day | ORAL | Status: DC
  Administered 2012-10-27 – 2012-11-01 (×6): 0.8 mg via ORAL
  Filled 2012-10-27 (×7): qty 2

## 2012-10-27 MED ORDER — HEPARIN SODIUM (PORCINE) 5000 UNIT/ML IJ SOLN
5000.0000 [IU] | Freq: Three times a day (TID) | INTRAMUSCULAR | Status: DC
  Administered 2012-10-27 – 2012-11-02 (×20): 5000 [IU] via SUBCUTANEOUS
  Filled 2012-10-27 (×22): qty 1

## 2012-10-27 MED ORDER — LORAZEPAM 2 MG/ML IJ SOLN
1.0000 mg | Freq: Once | INTRAMUSCULAR | Status: AC
  Administered 2012-10-27: 1 mg via INTRAVENOUS
  Filled 2012-10-27: qty 1

## 2012-10-27 MED ORDER — FINASTERIDE 5 MG PO TABS
5.0000 mg | ORAL_TABLET | Freq: Every day | ORAL | Status: DC
  Administered 2012-10-27 – 2012-11-02 (×7): 5 mg via ORAL
  Filled 2012-10-27 (×7): qty 1

## 2012-10-27 MED ORDER — SODIUM CHLORIDE 0.9 % IJ SOLN
3.0000 mL | Freq: Two times a day (BID) | INTRAMUSCULAR | Status: DC
  Administered 2012-10-27 (×2): 3 mL via INTRAVENOUS

## 2012-10-27 MED ORDER — SODIUM CHLORIDE 0.9 % IV SOLN: 250.0000 mL | INTRAVENOUS | Status: DC | PRN

## 2012-10-27 MED ORDER — ASPIRIN 81 MG PO CHEW
81.0000 mg | CHEWABLE_TABLET | Freq: Every day | ORAL | Status: DC
Start: 2012-10-27 — End: 2012-11-02
  Administered 2012-10-27 – 2012-11-02 (×7): 81 mg via ORAL
  Filled 2012-10-27 (×7): qty 1

## 2012-10-27 MED ORDER — ACETAMINOPHEN 325 MG PO TABS
650.0000 mg | ORAL_TABLET | Freq: Four times a day (QID) | ORAL | Status: DC | PRN
  Administered 2012-10-27 – 2012-10-28 (×3): 650 mg via ORAL
  Filled 2012-10-27 (×3): qty 2

## 2012-10-27 MED ORDER — ACETAMINOPHEN 650 MG RE SUPP
650.0000 mg | Freq: Four times a day (QID) | RECTAL | Status: DC | PRN
  Filled 2012-10-27: qty 1

## 2012-10-27 MED ORDER — HYDRALAZINE HCL 20 MG/ML IJ SOLN
5.0000 mg | INTRAMUSCULAR | Status: DC | PRN
  Administered 2012-10-29 – 2012-10-30 (×2): 5 mg via INTRAVENOUS
  Filled 2012-10-27 (×2): qty 1

## 2012-10-27 MED ORDER — SODIUM CHLORIDE 0.9 % IJ SOLN
3.0000 mL | Freq: Two times a day (BID) | INTRAMUSCULAR | Status: DC
  Administered 2012-10-27 – 2012-10-29 (×5): 3 mL via INTRAVENOUS

## 2012-10-27 MED ORDER — DEXTROSE 5 % IV SOLN
1.0000 g | INTRAVENOUS | Status: DC
  Administered 2012-10-27 – 2012-10-28 (×2): 1 g via INTRAVENOUS
  Filled 2012-10-27 (×2): qty 10

## 2012-10-27 NOTE — Discharge Summary (Signed)
Family Medicine Teaching [facility] Discharge Summary  Patient name: Adam Garner Medical record number: [id number] Date of birth: [birth year] Age: 79 y.o. Gender: male Date of Admission: 10/26/2012  Date of Discharge: 11/02/2012  Admitting Physician: Sanjuana Letters, MD  Primary Care Provider: Shelly Flatten, MD Consultants: neurology  Indication for Hospitalization: acute encephalopathy and uti  Discharge Diagnoses/Problem List:  Patient Active Problem List   Diagnosis Date Noted  . Unstable gait 10/30/2012  . Acute encephalopathy 10/27/2012  . UTI (lower urinary tract infection) 10/27/2012  . Peripheral neuropathy 06/08/2012  . At high risk for falls 05/10/2012  . A-fib 04/22/2012  . DOE (dyspnea on exertion) 04/01/2012  . Other malaise and fatigue 03/23/2012  . Balance problem 11/19/2010  . Nonallopathic lesion of cervicothoracic region 11/02/2010  . Nonallopathic lesion of thoracic region 11/02/2010  . URINARY URGENCY 02/28/2009  . HYPERLIPIDEMIA 11/10/2007  . ANEMIA 11/10/2007  . BENIGN PROSTATIC HYPERTROPHY, MILD, HX OF 02/10/2007  . OSTEOPOROSIS 10/14/2006  . Hypertension 10/14/2006    Disposition: SNF  Discharge Condition: stable  Discharge Exam:  General: elderly man, lying in bed, NAD  HEENT: MMM, NCAT, EOMI  Cardiovascular: irregularly irregular rate, no murmur  Respiratory: CTAB, normal WOB  Abdomen: soft, +bs, nt, nd, no suprapubic tenderness  Extremities: no LE edema Skin: intact, no rashes  Neuro: alert and oriented x3 (this waxes and wanes), confabulating  Brief Hospital Course: Adam Garner is a 79 y.o. male presenting with AMS and UTI. PMH is significant for afib, HTN, HLD, BPH, osteoporosis, and parkinsonism.  # AMS: Patient appeared encephalopathic throughout hospitalization and had various delusions of being controlled by unknown forces. He was redirectable and not aggressive. He did not noticeably improve with treatment of his UTI  but his encephalopathy was still thought to be due to some delirium from this infection and his hospitalization as well as some worsening of likely baseline dementia. Patient is highly educated and seemed to become more agitated when he felt he was not being adequately respected/listened to.  # UTI/retention: Patient was unable to report symptoms but was treated due to delirium. He got rocephin initially which was transitioned to PO keflex to finish a 10 day total course of antibiotics ending on day of discharge. His outpatient urologist, Dr. Vernie Ammons, was contacted and recommended discharge with foley in place to follow up with him for possible procedure.  # Hypertension: He was hypertensive throughout his stay in the 150s-190s systolic. Amlodipine 10mg  was started prior to discharge with the goal of allowing him to run high to avoid hypotension and falls.  Issues for Follow Up:  Recommend trial of aricept, namenda or possibly depakote for likely dementia Avoid aggressive treatment of hypertension due to fall risk. Consider TERP or other procedure for more definitive treatment of urinary retention/BPH as this is long standing and could easily lead to more incidents of delirium given his baseline level of neurologic degeneration. Patient was not calmed with 2mg  or 5mg  doses of Haldol for agitation overnight so he is sent home with PRN Haldol 10mg  for agitation only to be given if he is at risk of harming himself or others, which he has been on at least two occasions during hospitalization.  Significant Procedures: none  Significant Labs and Imaging:   Recent Labs Lab 10/27/12 0452 10/28/12 0540 10/31/12 1140  WBC 9.6 10.4 8.7  HGB 15.6 14.7 15.3  HCT 43.2 41.2 43.8  PLT 242 219 233    Recent Labs Lab 10/26/12  2229 10/27/12 0837 10/28/12 0540 10/31/12 1140  NA 142 145 145 140  K 3.8 4.3 3.9 3.5  CL 108 110 109 104  CO2  --  26 24 26   GLUCOSE 118* 80 93 109*  BUN 20 16 16 16    CREATININE 1.20 0.95 0.87 1.11  CALCIUM  --  9.3 8.7 9.3    10/26/2012 23:54   Color, Urine  YELLOW   APPearance  CLOUDY (A)   Specific Gravity, Urine  1.019   pH  5.5   Glucose  NEGATIVE   Bilirubin Urine  NEGATIVE   Ketones, ur  NEGATIVE   Protein  NEGATIVE   Urobilinogen, UA  0.2   Nitrite  NEGATIVE   Leukocytes, UA  MODERATE (A)   Hgb urine dipstick  NEGATIVE   WBC, UA  21-50   Squamous Epithelial / LPF  RARE   Crystals  CA OXALATE CRYSTALS (A)   UCx 10/12: >100,000 colonies of E.coli, pan-sensitive  UCx 10/14: 8,000 colonies, insignificant growth  Lactic acid 1.14   CXR: Cardiac silhouette appears moderately enlarged, even with consideration to this low inspiratory examination with crowded vasculature markings. No pleural effusions. No focal consolidations. No pneumothorax.   EKG: afib, no ST or T-wave changes, stable from prior   CT head: Exam is motion degraded. No intracranial hemorrhage. Global atrophy. Prominent small vessel disease type changes without CT evidence of large acute infarct.   MRI brain: Fast technique imaging had to be utilized. Sequences are motion degraded. No acute infarct. No intracranial hemorrhage. Moderate small vessel disease type changes. Moderate global atrophy. Ventricular prominence may be related to atrophy. Difficult to completely exclude a mild component of hydrocephalus. Aqueduct is patent. No intracranial mass lesion noted on this unenhanced exam. Right vertebral artery diminutive in size. Remainder of major intracranial vascular structures are patent with tortuous left vertebral artery and basilar artery. Partially empty sella incidentally noted. Cervical medullary junction and orbital structures unremarkable.  EEG: This EEG is abnormal with mild generalized nonspecific continuous slowing of cerebral activity. This pattern of slowing can be seen with degenerative as well as metabolic encephalopathy. Diffuse theta activity can be seen with  use of sedating medications, most often with benzodiazepines. No evidence of an epileptic disorder was seen.   Results/Tests Pending at Time of Discharge: none  Discharge Medications:    Medication List    STOP taking these medications       cephALEXin 500 MG capsule  Commonly known as:  KEFLEX      TAKE these medications       amLODipine 10 MG tablet  Commonly known as:  NORVASC  Take 1 tablet (10 mg total) by mouth daily.     aspirin 81 MG tablet  Take 81 mg by mouth daily.     finasteride 5 MG tablet  Commonly known as:  PROSCAR  Take 5 mg by mouth daily.     fish oil-omega-3 fatty acids 1000 MG capsule  Take 1 g by mouth 2 (two) times daily.     haloperidol 10 MG tablet  Commonly known as:  HALDOL  Take 1 tablet (10 mg total) by mouth daily as needed (Agitation/aggression).     multivitamin with minerals Tabs tablet  Take 1 tablet by mouth daily.     tamsulosin 0.4 MG Caps capsule  Commonly known as:  FLOMAX  Take 0.8 mg by mouth daily after supper.     traZODone 50 MG tablet  Commonly known as:  DESYREL  Take 1 tablet (50 mg total) by mouth at bedtime as needed for sleep.        Discharge Instructions: Please refer to Patient Instructions section of EMR for full details.  Patient was counseled important signs and symptoms that should prompt return to medical care, changes in medications, dietary instructions, activity restrictions, and follow up appointments.   Follow-Up Appointments: Follow-up Information   Follow up with Garnett Farm, MD On 11/09/2012. (8am)    Specialty:  Urology   Contact information:   [address] [place] [address] [phone]       Beverely Low, MD 11/02/2012, 1:13 PM PGY-1, [facility]

## 2012-10-27 NOTE — Progress Notes (Signed)
Called lab at [facility] to add on urine culture to urine specimen sent last night in the ED for the UA.

## 2012-10-27 NOTE — H&P (Signed)
Family Medicine Teaching [facility] Admission History and Physical Service Pager: [phone]  Patient name: Adam Garner Medical record number: [id number] Date of birth: [birth year] Age: 79 y.o. Gender: male  Primary Care Provider: MERRELL, DAVID, MD Consultants: none Code Status: full  Chief Complaint: AMS  Assessment and Plan: Adam Garner is a 79 y.o. male presenting with AMS and UTI. PMH is significant for afib, HTN, HLD, BPH, osteoporosis, parkinsons.   # Acute encephalopathy: negative CT and MRI on 10/12, no focal deficits to suggest stroke, most likely delirium 2/2 UTI vs worsening of some baseline dementia - q4h neuro checks - CBC, BMET wnl - am CBC, BMET  # UTI: denies symptoms but altered, incontinent at baseline, UA with moderate LE and 21-50 WBCs, unclear why this failed outpatient treatment but may be due to noncompliance vs. it was adequately treated and he can't report improvement due to confusion - UCx from 10/12 with pansensitive e. Coli - IV rocephin q24 - repeat UCx - Once mental status improves will transition to PO based on culture results.   # BPH - Continuing home Flomax and Finasteride  # HTN - Patient on no medication in the outpatient setting - PRN Hydralazine for BP >180/110  # Afib - Currently rate controlled.  Patient being followed by [place]. - Per Cards, given gait instability and falls patient is not a good candidate for anticoagulation - Will continue daily ASA  FEN/GI: regular diet, saline lock IV Prophylaxis: SQ heparin  Disposition: admit to telemetry pending clinical improvement and transition to po antibiotics  History of Present Illness: Adam Garner is a 79 y.o. male presenting with confusion for the past 5 days and UTI diagnosed at ED visit on the 12th. He endorses urinary incontinence which is chronic but denies dysuria, frequency, urgency, abdominal pain, nausea, fever or any other complaints. Level 5 caveat applies as  patient is altered and girlfriend was not present to assist with history. History primarily obtained through review of the medical record.  He lives with his girlfriend of many years and she noticed he was acting confused about 5 days ago and brought him to the ED where he received one dose of rocephin and was sent home with keflex.  Review Of Systems: Per HPI Otherwise 12 point review of systems was performed and was unremarkable.  Patient Active Problem List   Diagnosis Date Noted  . Peripheral neuropathy 06/08/2012  . At high risk for falls 05/10/2012  . A-fib 04/22/2012  . DOE (dyspnea on exertion) 04/01/2012  . Other malaise and fatigue 03/23/2012  . Balance problem 11/19/2010  . Nonallopathic lesion of cervicothoracic region 11/02/2010  . Nonallopathic lesion of thoracic region 11/02/2010  . URINARY URGENCY 02/28/2009  . HYPERLIPIDEMIA 11/10/2007  . ANEMIA 11/10/2007  . BENIGN PROSTATIC HYPERTROPHY, MILD, HX OF 02/10/2007  . OSTEOPOROSIS 10/14/2006  . Hypertension 10/14/2006   Past Medical History: Past Medical History  Diagnosis Date  . Osteoporosis   . BPH (benign prostatic hyperplasia)   . Hyperlipidemia   . BPH (benign prostatic hyperplasia)   . Anemia   . Macular degeneration   . Atrial fibrillation   . Parkinsonism    Past Surgical History: Past Surgical History  Procedure Laterality Date  . Tonsilectomy/adenoidectomy with myringotomy     Social History: History  Substance Use Topics  . Smoking status: Never Smoker   . Smokeless tobacco: Not on file  . Alcohol Use: No     Comment: none  Additional social history: Lives with girlfriend of many years, reports he is independent with ADLs Please also refer to relevant sections of EMR.  Family History: Family History  Problem Relation Age of Onset  . Heart disease Mother   . Heart disease Father    Allergies and Medications: Allergies  Allergen Reactions  . Penicillins     unknown  . Sulfonamide  Derivatives     unknown   No current facility-administered medications on file prior to encounter.   Current Outpatient Prescriptions on File Prior to Encounter  Medication Sig Dispense Refill  . aspirin 81 MG tablet Take 81 mg by mouth daily.      . cephALEXin (KEFLEX) 500 MG capsule Take 1 capsule (500 mg total) by mouth 4 (four) times daily.  28 capsule  0  . finasteride (PROSCAR) 5 MG tablet Take 5 mg by mouth daily.      . fish oil-omega-3 fatty acids 1000 MG capsule Take 1 g by mouth 2 (two) times daily.       . Multiple Vitamin (MULTIVITAMIN WITH MINERALS) TABS tablet Take 1 tablet by mouth daily.      . tamsulosin (FLOMAX) 0.4 MG CAPS capsule Take 0.8 mg by mouth daily after supper.        Objective: BP 150/69  Pulse 68  Temp(Src) 97.6 F (36.4 C) (Oral)  Resp 24  SpO2 96% Exam: General: elderly man, lying in bed, NAD HEENT: PERRL, EOMI,  Cardiovascular: irregularly irregular rate, no murmur Respiratory: CTAB, normal WOB Abdomen: soft, +bs, nt, nd, no suprapubic tenderness Extremities: 2+ LE edema to the mid calf Skin: intact, no rashes Neuro: alert and oriented x3 (not oriented to time), somewhat confused about events leading up to hospitalization, intention tremor with finger to nose testing, otherwise no focal deficits, CN 2-12 intact.    Labs and Imaging: CBC BMET   Recent Labs Lab 10/26/12 2220 10/26/12 2229  WBC 10.5  --   HGB 15.8 15.6  HCT 43.8 46.0  PLT 233  --     Recent Labs Lab 10/24/12 0840 10/26/12 2229  NA 139 142  K 4.2 3.8  CL 104 108  CO2 24  --   BUN 27* 20  CREATININE 1.02 1.20  GLUCOSE 117* 118*  CALCIUM 10.0  --       10/26/2012 23:54  Color, Urine YELLOW  APPearance CLOUDY (A)  Specific Gravity, Urine 1.019  pH 5.5  Glucose NEGATIVE  Bilirubin Urine NEGATIVE  Ketones, ur NEGATIVE  Protein NEGATIVE  Urobilinogen, UA 0.2  Nitrite NEGATIVE  Leukocytes, UA MODERATE (A)  Hgb urine dipstick NEGATIVE  WBC, UA 21-50   Squamous Epithelial / LPF RARE  Crystals CA OXALATE CRYSTALS (A)   UCx: >100,000 colonies of E.coli, pan-sensitive Lactic acid 1.14  CXR: Cardiac silhouette appears moderately enlarged, even with  consideration to this low inspiratory examination with crowded  vasculature markings. No pleural effusions. No focal  consolidations. No pneumothorax.  EKG: afib, no ST or T-wave changes, stable from prior  CT head: Exam is motion degraded. No intracranial hemorrhage. Global atrophy. Prominent small vessel disease type changes without CT evidence of large acute infarct.  MRI brain: Fast technique imaging had to be utilized. Sequences are motion  degraded. No acute infarct. No intracranial hemorrhage. Moderate small vessel disease type changes. Moderate global atrophy. Ventricular prominence may be related to atrophy. Difficult to completely exclude a mild component of hydrocephalus. Aqueduct is patent. No intracranial mass lesion noted on this  unenhanced exam. Right vertebral artery diminutive in size. Remainder of major intracranial vascular structures are patent with tortuous left vertebral artery and basilar artery. Partially empty sella incidentally noted. Cervical medullary junction and orbital structures unremarkable.  Beverely Low, MD 10/27/2012, 4:06 AM PGY-1, [facility] FPTS Intern pager: [phone], text pages welcome  I have seen the above patient with Dr. Richarda Blade.  I agree with the note.  Changes have been made in [facility].   Everlene Other DO Family Medicine PGY-2

## 2012-10-27 NOTE — Progress Notes (Signed)
Quick Note:  Please call and advise the patient that the recent labs we checked were within normal limits. We checked blood count, chemistry, thyroid screen, liver function, and inflammatory markers. No further action is required on these tests at this time. Please remind patient to keep any upcoming appointments or tests and to call us with any interim questions, concerns, problems or updates. Thanks,  [name], MD, PhD    ______

## 2012-10-27 NOTE — Progress Notes (Signed)
Mr. Stencil girlfriend/HCPOA is [name]. She can be reached at: (h) [phone] or ([phone].

## 2012-10-27 NOTE — ED Provider Notes (Signed)
Medical screening examination/treatment/procedure(s) were conducted as a shared visit with non-physician practitioner(s) and myself.  I personally evaluated the patient during the encounter  I interviewed and examined the patient. Lungs are CTAB. Cardiac exam wnl. Abdomen soft. Pt confused on exam and tangential. Will plan on admission.   Junius Argyle, MD 10/27/12 [phone]

## 2012-10-27 NOTE — Progress Notes (Signed)
Received order to measure post void residual. Patient at baseline is incontinent of urine and reportedly "leaks" all day and wears depends. Pt has a condom catheter on currently to keep him dry and to measure output. He is unable to tell me he needs to urinate so I am unable to get an accurate PVR. Did a random bladder scan that showed and at that time he has no complaints of distension or urge to void. Pt has BPH and says he used to cath himself throughout the day but since he is on the flomax and proscar he is able to urinate on his own and no longer caths, but is incontinent. Output from 9am to 2 pm is .

## 2012-10-27 NOTE — H&P (Signed)
Seen and examined.  Chart reviewed.  Discussed with Dr. Adriana Simas.  Agree with his management and documentation.   Briefly, 79 yo male with altered mental status.  Unclear to me how much is acute versus chronic.  Issues: 1. Acute delirium on top of chronic dementia.  Chart has referred to dx of dementia but I don't see that he has been officially labeled with that diagnosis.  I believe he deserves the dx (unless he rapidly clears with treatment of the UTI.)  From the dementia standpoint, his physical exam is much more consistent with Alzheimer's type dementia as opposed to Parkinsonism which has also been mentioned.  I believe he would benefit from a trial of Aricept.  Also, for completeness, we should check a TSH.  Other tests for reversible causes of dementia have been done recently. From the delirium standpoint, the only acute cause I can see is his UTI - see #2. 2. UTI, no evidence of upper track disease although a UTI in a male is always complicated.  We have sensitivities and antibiotic treatment is appropriate.  We should also check a post void residual on him - always a good idea in an elderly male with UTI. 3. Hypertension.  May be due to aggitation.  Likely needs some treatment but agree with going slowly to avoid increased fall risk. 4. Dispo is likely the critical issue.  Will he be able to return to his previous living situation?  He will need PT, OT and case management.  Dispo could end up being to nursing home - but I hope by treating the UTI, hypertension and adding aricept, we might be able to DC to home.  It would be good to get palliative care involved, specifically to work on the issue of advanced directives.  He lives with his "dear friend" of seven years.  It will be important to complete a health care POA if he wants her to be the decision maker.  She does not have any legal status even though she has been closest to him.

## 2012-10-28 LAB — URINE CULTURE

## 2012-10-28 LAB — BASIC METABOLIC PANEL
BUN: 16 mg/dL (ref 6–23)
Calcium: 8.7 mg/dL (ref 8.4–10.5)
Creatinine, Ser: 0.87 mg/dL (ref 0.50–1.35)
GFR calc non Af Amer: 75 mL/min — ABNORMAL LOW (ref >90)
Glucose, Bld: 93 mg/dL (ref 70–99)

## 2012-10-28 LAB — CBC
HCT: 41.2 % (ref 39.0–52.0)
Hemoglobin: 14.7 g/dL (ref 13.0–17.0)
MCH: 32.1 pg (ref 26.0–34.0)
MCHC: 35.7 g/dL (ref 30.0–36.0)
MCV: 90 fL (ref 78.0–100.0)
Platelets: 219 10*3/uL (ref 150–400)

## 2012-10-28 NOTE — Progress Notes (Signed)
Family Medicine Teaching Service Daily Progress Note Intern Pager: [phone]  Patient name: Adam Garner Medical record number: [id number] Date of birth: 24-Sep-1921 Age: 79 y.o. Gender: male  Primary Care Provider: MERRELL, DAVID, MD Consultants: none Code Status: full  Pt Overview and Major Events to Date:  10/12 - presented to ED with encephalopathy found to have UTI 10/14 - admitted with encephalopathy and UTI  Assessment and Plan: RYLON POITRA is a 79 y.o. male presenting with AMS and UTI. PMH is significant for afib, HTN, HLD, BPH, osteoporosis, parkinsons.   # Acute encephalopathy: negative CT and MRI on 10/12, no focal deficits to suggest stroke, most likely delirium 2/2 UTI vs worsening of some baseline dementia  - q4h neuro checks  - CBC, BMET wnl  - neuro saw on 10/13, likely multifactorial, no recs - PT/OT/SNF c/s'ed, likely will need SNF placement  # UTI: denies symptoms but altered, incontinent at baseline, UA with moderate LE and 21-50 WBCs, unclear why this failed outpatient treatment but may be due to noncompliance vs. it was adequately treated and he can't report improvement due to confusion  - UCx from 10/12 with pansensitive e. coli  - IV rocephin q24  - repeat UCx pending - Once mental status improves will transition to PO based on culture results.   # BPH  - Continuing home Flomax and Finasteride   # HTN: initially up to 180s systolic, now improved in 150s/60s-80  - Patient on no medication in the outpatient setting  - PRN Hydralazine for BP >180/110   # Afib  - Currently rate controlled without meds. Patient being followed by [place].  - Per Cards, given gait instability and falls patient is not a good candidate for anticoagulation  - Will continue daily ASA   FEN/GI: regular diet, saline lock IV  Prophylaxis: SQ heparin   Disposition: pending clinical improvement and transition to po antibiotics, anticipate d/c to SNF tomorrow if able to  place  Subjective: Feeling well, no complaints this am, girlfriend reports he was very agitated overnight, he is still perseverating on conspiracy theories  Objective: Temp:  [97.4 F (36.3 C)-98.4 F (36.9 C)] 98 F (36.7 C) (10/16 0607) Pulse Rate:  [54-100] 100 (10/16 0607) Resp:  [20] 20 (10/15 2239) BP: (151-176)/(66-83) 151/80 mmHg (10/16 0607) SpO2:  [96 %-100 %] 100 % (10/16 0607) Physical Exam: General: elderly man, lying in bed, NAD  HEENT: MMM, NCAT, EOMI  Cardiovascular: irregularly irregular rate, no murmur  Respiratory: CTAB, normal WOB  Abdomen: soft, +bs, nt, nd, no suprapubic tenderness  Extremities: 2+ LE edema to the mid calf  Skin: intact, no rashes  Neuro: alert and oriented x2 (not oriented to time), delirious and perseverating on conspiracy theories  Laboratory:  Recent Labs Lab 10/26/12 2220 10/26/12 2229 10/27/12 0452 10/28/12 0540  WBC 10.5  --  9.6 10.4  HGB 15.8 15.6 15.6 14.7  HCT 43.8 46.0 43.2 41.2  PLT 233  --  242 219    Recent Labs Lab 10/24/12 0840 10/26/12 1110 10/26/12 2229 10/27/12 0837 10/28/12 0540  NA 139 143 142 145 145  K 4.2 4.2 3.8 4.3 3.9  CL 104 103 108 110 109  CO2 24 19  --  26 24  BUN 27* 16 20 16 16   CREATININE 1.02 1.07 1.20 0.95 0.87  CALCIUM 10.0 9.8  --  9.3 8.7  PROT 7.3 7.0  --   --   --   BILITOT 0.8 0.7  --   --   --  ALKPHOS 62 64  --   --   --   ALT 37 31  --   --   --   AST 42* 32  --   --   --   GLUCOSE 117* 104* 118* 80 93    10/26/2012 23:54   Color, Urine  YELLOW   APPearance  CLOUDY (A)   Specific Gravity, Urine  1.019   pH  5.5   Glucose  NEGATIVE   Bilirubin Urine  NEGATIVE   Ketones, ur  NEGATIVE   Protein  NEGATIVE   Urobilinogen, UA  0.2   Nitrite  NEGATIVE   Leukocytes, UA  MODERATE (A)   Hgb urine dipstick  NEGATIVE   WBC, UA  21-50   Squamous Epithelial / LPF  RARE   Crystals  CA OXALATE CRYSTALS (A)   UCx 10/12: >100,000 colonies of E.coli, pan-sensitive UCx 10/14:  pending  Lactic acid 1.14   CXR: Cardiac silhouette appears moderately enlarged, even with consideration to this low inspiratory examination with crowded vasculature markings. No pleural effusions. No focal consolidations. No pneumothorax.   EKG: afib, no ST or T-wave changes, stable from prior   CT head: Exam is motion degraded. No intracranial hemorrhage. Global atrophy. Prominent small vessel disease type changes without CT evidence of large acute infarct.   MRI brain: Fast technique imaging had to be utilized. Sequences are motion  degraded. No acute infarct. No intracranial hemorrhage. Moderate small vessel disease type changes. Moderate global atrophy. Ventricular prominence may be related to atrophy. Difficult to completely exclude a mild component of hydrocephalus. Aqueduct is patent. No intracranial mass lesion noted on this unenhanced exam. Right vertebral artery diminutive in size. Remainder of major intracranial vascular structures are patent with tortuous left vertebral artery and basilar artery. Partially empty sella incidentally noted. Cervical medullary junction and orbital structures unremarkable.  Beverely Low, MD 10/28/2012, 9:27 AM PGY-1, [facility] Family [facility] FPTS Intern pager: [phone], text pages welcome

## 2012-10-28 NOTE — Progress Notes (Signed)
Quick Note:  I called and relayed that lab results normal. She verbalized understanding. ______

## 2012-10-28 NOTE — Evaluation (Signed)
Occupational Therapy Evaluation Patient Details Name: Adam Garner MRN: [id number] DOB: 03/06/1921 Today's Date: 10/28/2012 Time: 7829-5621 OT Time Calculation (min): 39 min  OT Assessment / Plan / Recommendation History of present illness Adam Garner is a 79 y.o. male presenting with AMS and UTI. PMH is significant for afib, HTN, HLD, BPH, osteoporosis, parkinsons and dementia.   Clinical Impression   Pt admitted with above.  Pt requiring assist for functional mobility within room.  Pt with loose stool during session and required assist with peri care and bathing/dressing. RN made aware.  Pt is an unreliable historian and no family/caregiver available during session to determine level of assist available at home. Will recommend SNF at this time to further progress rehab and maximize pt independence before eventual return home. Will continue to follow pt acutely in order to address below problem list.    OT Assessment  Patient needs continued OT Services    Follow Up Recommendations  SNF;Supervision/Assistance - 24 hour    Barriers to Discharge   No family present to determine level of assist available. Pt is unreliable historian.  Equipment Recommendations  3 in 1 bedside comode    Recommendations for Other Services    Frequency  Min 2X/week    Precautions / Restrictions Precautions Precautions: Fall Restrictions Weight Bearing Restrictions: No   Pertinent Vitals/Pain See vitals    ADL  Grooming: Performed;Wash/dry hands;Min guard Where Assessed - Grooming: Unsupported standing Upper Body Bathing: Simulated;Supervision/safety Where Assessed - Upper Body Bathing: Unsupported sitting Lower Body Bathing: Performed;Minimal assistance Where Assessed - Lower Body Bathing: Supported sit to stand Upper Body Dressing: Performed;Supervision/safety Where Assessed - Upper Body Dressing: Unsupported sitting Lower Body Dressing: Performed;Minimal assistance Where Assessed - Lower  Body Dressing: Unsupported sit to stand Toilet Transfer: Performed;Minimal assistance Toilet Transfer Method: Sit to Barista: Comfort height toilet;Grab bars Toileting - Clothing Manipulation and Hygiene: Performed;Minimal assistance Where Assessed - Engineer, mining and Hygiene: Sit to stand from 3-in-1 or toilet Equipment Used: Gait belt Transfers/Ambulation Related to ADLs: Min assist with RUE HHA for support. ADL Comments: Pt with loose stool upon standing and while ambulating to bathroom.  Pt also with urinary incontinence while ambulating to bathroom with assist.  Pt states he always wears depends at home.  Assisted pt with clean up- LB bathing and donning clean gown. Pt is oriented x4 but is very tangential in conversation, requiring redirection to respond to therapist questions at times. Pt stating things such as "I know how the red light works and thinks he can fix it."  Unsure if cognition is at baseline due to h/o dementia. No family/caregiver present during session.    OT Diagnosis: Generalized weakness;Cognitive deficits  OT Problem List: Decreased cognition;Impaired balance (sitting and/or standing) OT Treatment Interventions: Self-care/ADL training;Therapeutic activities;Cognitive remediation/compensation;Patient/family education;Balance training   OT Goals(Current goals can be found in the care plan section) Acute Rehab OT Goals Patient Stated Goal: none stated OT Goal Formulation: With patient Time For Goal Achievement: 11/04/12 Potential to Achieve Goals: Good  Visit Information  Last OT Received On: 10/28/12 Assistance Needed: +1 History of Present Illness: Adam Garner is a 79 y.o. male presenting with AMS and UTI. PMH is significant for afib, HTN, HLD, BPH, osteoporosis, parkinsons and dementia.       Prior Functioning     Home Living Family/patient expects to be discharged to:: Private residence Living Arrangements:  Spouse/significant other Available Help at Discharge: Family;Available 24 hours/day Type of Home:  House Home Access: Level entry Home Layout: One level Home Equipment: Walker - 2 wheels;Cane - single point;Shower seat - built in Prior Function Level of Independence: Independent with assistive device(s) (uses RW occasionally) Comments: Pt with h/o dementia so unsure of information acccuracy. No family/caregiver present during session. Communication Communication: No difficulties         Vision/Perception Vision - History Baseline Vision: Wears glasses only for reading   Cognition  Cognition Arousal/Alertness: Awake/alert Behavior During Therapy: WFL for tasks assessed/performed Overall Cognitive Status: No family/caregiver present to determine baseline cognitive functioning (baseline dementia)    Extremity/Trunk Assessment Upper Extremity Assessment Upper Extremity Assessment: Overall WFL for tasks assessed Lower Extremity Assessment Lower Extremity Assessment: Overall WFL for tasks assessed     Mobility Bed Mobility Bed Mobility: Supine to Sit;Sitting - Scoot to Edge of Bed Supine to Sit: 5: Supervision Sitting - Scoot to Edge of Bed: 5: Supervision Transfers Transfers: Sit to Stand;Stand to Sit Sit to Stand: 4: Min assist;From toilet;From bed;With upper extremity assist Stand to Sit: 4: Min guard;To toilet;To chair/3-in-1;With armrests;With upper extremity assist Details for Transfer Assistance: Assist for power up and for intial steadying once in upright standing position.     Exercise     Balance Balance Balance Assessed: Yes Static Sitting Balance Static Sitting - Balance Support: Feet supported Static Sitting - Level of Assistance: 7: Independent Static Standing Balance Static Standing - Balance Support: During functional activity Static Standing - Level of Assistance: 5: Stand by assistance;4: Min assist Dynamic Standing Balance Dynamic Standing - Balance  Support: During functional activity Dynamic Standing - Balance Activities: Lateral lean/weight shifting;Forward lean/weight shifting;Reaching for objects Dynamic Standing - Comments: min guard    End of Session OT - End of Session Equipment Utilized During Treatment: Gait belt Activity Tolerance: Patient tolerated treatment well Patient left: in chair;with chair alarm set;with call bell/phone within reach Nurse Communication: Mobility status  GO    10/28/2012 Cipriano Mile OTR/L Pager [phone] Office [phone]  Cipriano Mile 10/28/2012, 11:17 AM

## 2012-10-28 NOTE — Progress Notes (Signed)
Clinical Social Work Department CLINICAL SOCIAL WORK PLACEMENT NOTE 10/28/2012  Patient:  MEHTAAB, MAYEDA  Account Number:  [id number] Admit date:  10/26/2012  Clinical Social Worker:  Carren Rang  Date/time:  10/28/2012 03:30 PM  Clinical Social Work is seeking post-discharge placement for this patient at the following level of care:   SKILLED NURSING   (*CSW will update this form in Epic as items are completed)   10/28/2012  Patient/family provided with [name] Department of Clinical Social Work's list of facilities offering this level of care within the geographic area requested by the patient (or if unable, by the patient's family).  10/28/2012  Patient/family informed of their freedom to choose among providers that offer the needed level of care, that participate in Medicare, Medicaid or managed care program needed by the patient, have an available bed and are willing to accept the patient.  10/28/2012  Patient/family informed of [facility]' ownership interest in [facility], as well as of the fact that they are under no obligation to receive care at this facility.  PASARR submitted to EDS on 10/28/2012 PASARR number received from EDS on 10/28/2012  FL2 transmitted to all facilities in geographic area requested by pt/family on  10/28/2012 FL2 transmitted to all facilities within larger geographic area on   Patient informed that his/her managed care company has contracts with or will negotiate with  certain facilities, including the following:     Patient/family informed of bed offers received:   Patient chooses bed at  Physician recommends and patient chooses bed at    Patient to be transferred to  on   Patient to be transferred to facility by   The following physician request were entered in Epic:   Additional Comments:  Maree Krabbe, MSW, [employer] [phone]

## 2012-10-28 NOTE — Progress Notes (Signed)
Clinical Social Work Department BRIEF PSYCHOSOCIAL ASSESSMENT 10/28/2012  Patient:  Adam Garner, Adam Garner     Account Number:  [id number]     Admit date:  10/26/2012  Clinical Social Worker:  Carren Rang  Date/Time:  10/28/2012 03:24 PM  Referred by:  Physician  Date Referred:  10/28/2012 Referred for  SNF Placement   Other Referral:   Interview type:  Other - See comment Other interview type:   CSW spoke to patient and friend at bedside.    PSYCHOSOCIAL DATA Living Status:  ALONE Admitted from facility:   Level of care:   Primary support name:  Haupt,Jeane Primary support relationship to patient:  FRIEND Degree of support available:   Good    CURRENT CONCERNS Current Concerns  Post-Acute Placement   Other Concerns:    SOCIAL WORK ASSESSMENT / PLAN Clinical Social Worker received referral for SNF placement at d/c. CSW introduced self and explained reason for visit. Patient had visitor by bedside. CSW explained SNF process to patient and patient's friend. Patient reported by nodding that he is agreeable for SNF placement. Patient's friend states she think it is [place] for patient to go to SNF as long as insurance will cover it. Patient has [facility]. CSW faxed over clinicals to [facility] and followed up with a phone call to [facility], representative at [facility]. CSW awaiting insurance authorization.    CSW will complete FL2 for MD's signature and will update patient and friend when bed offers are received.   Assessment/plan status:  Psychosocial Support/Ongoing Assessment of Needs Other assessment/ plan:   Information/referral to community resources:   SNF information    PATIENT'S/FAMILY'S RESPONSE TO PLAN OF CARE: Patient and patient's friend stated they are agreeable for SNF placement after discharge as long as insurance covers the stay.        Maree Krabbe, MSW, [facility] Majors [phone]

## 2012-10-28 NOTE — Evaluation (Signed)
Physical Therapy Evaluation Patient Details Name: Adam Garner MRN: [id number] DOB: [birth year] Today's Date: 10/28/2012 Time: 4098-1191 PT Time Calculation (min): 39 min  PT Assessment / Plan / Recommendation History of Present Illness  Adam Garner is a 79 y.o. male presenting with AMS and UTI. PMH is significant for afib, HTN, HLD, BPH, osteoporosis, parkinsons and dementia.  Clinical Impression  Patient demonstrates deficits in functional mobility as indicated below. Unable to determine patient baseline prior to historian. Will continue to see as indicated. At this time, recommend ST SNF prior to discharge home pending patient progress. If patient progresses well and has adequate support will update recommendations as appropriate.   Of note, patient with explosive bowel movement upon standing, assisted patient with hygiene prior to conclusion of session.    PT Assessment  Patient needs continued PT services    Follow Up Recommendations  SNF;Supervision/Assistance - 24 hour       Barriers to Discharge Decreased caregiver support      Equipment Recommendations  None recommended by PT       Frequency Min 3X/week    Precautions / Restrictions Precautions Precautions: Fall Restrictions Weight Bearing Restrictions: No   Pertinent Vitals/Pain No pain reported      Mobility  Bed Mobility Bed Mobility: Supine to Sit;Sitting - Scoot to Edge of Bed Supine to Sit: 5: Supervision Sitting - Scoot to Edge of Bed: 5: Supervision Transfers Transfers: Sit to Stand;Stand to Sit Sit to Stand: 4: Min assist;From toilet;From bed;With upper extremity assist Stand to Sit: 4: Min guard;To toilet;To chair/3-in-1;With armrests;With upper extremity assist Details for Transfer Assistance: Assist for power up and for intial steadying once in upright standing position. Ambulation/Gait Ambulation/Gait Assistance: 4: Min guard;4: Min Environmental consultant (Feet): 40 Feet Assistive  device: 1 person hand held assist Gait Pattern: Step-through pattern;Decreased stride length;Shuffle;Narrow base of support        PT Diagnosis: Difficulty walking;Altered mental status  PT Problem List: Decreased activity tolerance;Decreased balance;Decreased mobility;Decreased cognition;Decreased safety awareness;Decreased knowledge of precautions PT Treatment Interventions: DME instruction;Gait training;Functional mobility training;Therapeutic activities;Therapeutic exercise;Balance training;Cognitive remediation;Patient/family education     PT Goals(Current goals can be found in the care plan section) Acute Rehab PT Goals Patient Stated Goal: none stated Time For Goal Achievement: 11/11/12 Potential to Achieve Goals: Fair  Visit Information  Last PT Received On: 10/28/12 Assistance Needed: +1 History of Present Illness: Adam Garner is a 79 y.o. male presenting with AMS and UTI. PMH is significant for afib, HTN, HLD, BPH, osteoporosis, parkinsons and dementia.       Prior Functioning  Home Living Family/patient expects to be discharged to:: Private residence Living Arrangements: Spouse/significant other Available Help at Discharge: Family;Available 24 hours/day Type of Home: House Home Access: Level entry Home Layout: One level Home Equipment: Walker - 2 wheels;Cane - single point;Shower seat - built in Prior Function Level of Independence: Independent with assistive device(s) (uses RW occasionally) Comments: Pt with h/o dementia so unsure of information acccuracy. No family/caregiver present during session. Communication Communication: No difficulties    Cognition  Cognition Arousal/Alertness: Awake/alert Behavior During Therapy: WFL for tasks assessed/performed Overall Cognitive Status: No family/caregiver present to determine baseline cognitive functioning (baseline dementia)    Extremity/Trunk Assessment Upper Extremity Assessment Upper Extremity Assessment:  Overall WFL for tasks assessed Lower Extremity Assessment Lower Extremity Assessment: Overall WFL for tasks assessed   Balance Balance Balance Assessed: Yes Static Sitting Balance Static Sitting - Balance Support: Feet supported Static Sitting - Level  of Assistance: 7: Independent Static Standing Balance Static Standing - Balance Support: During functional activity Static Standing - Level of Assistance: 5: Stand by assistance;4: Min assist Dynamic Standing Balance Dynamic Standing - Balance Support: During functional activity Dynamic Standing - Balance Activities: Lateral lean/weight shifting;Forward lean/weight shifting;Reaching for objects Dynamic Standing - Comments: min guard   End of Session PT - End of Session Equipment Utilized During Treatment: Gait belt Activity Tolerance: Patient tolerated treatment well Patient left: in chair;with call bell/phone within reach;with chair alarm set Nurse Communication: Mobility status  GP     Fabio Asa 10/28/2012, 11:06 AM Charlotte Crumb, PT DPT  [phone]

## 2012-10-28 NOTE — Progress Notes (Signed)
Family Medicine Teaching Service Attending Note  I interviewed and examined patient Adam Garner and reviewed their tests and x-rays.  I discussed with Dr. Richarda Blade and reviewed their note for today.  I agree with their assessment and plan.     Additionally  Seems to have baseline dementia - no evidence of any reversible causes Consider trial of aricept to see if improves QOL Stable for discharge

## 2012-10-29 MED ORDER — TRAZODONE HCL 50 MG PO TABS
50.0000 mg | ORAL_TABLET | Freq: Every evening | ORAL | Status: DC | PRN
  Administered 2012-10-29 – 2012-11-01 (×3): 50 mg via ORAL
  Filled 2012-10-29 (×3): qty 1

## 2012-10-29 MED ORDER — CEPHALEXIN 500 MG PO CAPS
500.0000 mg | ORAL_CAPSULE | Freq: Three times a day (TID) | ORAL | Status: DC
  Administered 2012-10-29 – 2012-11-02 (×16): 500 mg via ORAL
  Filled 2012-10-29 (×22): qty 1

## 2012-10-29 MED ORDER — AMLODIPINE BESYLATE 5 MG PO TABS
5.0000 mg | ORAL_TABLET | Freq: Every day | ORAL | Status: DC
  Administered 2012-10-29 – 2012-10-31 (×3): 5 mg via ORAL
  Filled 2012-10-29 (×3): qty 1

## 2012-10-29 MED ORDER — HALOPERIDOL 2 MG PO TABS
2.0000 mg | ORAL_TABLET | Freq: Once | ORAL | Status: AC
  Administered 2012-10-29: 2 mg via ORAL
  Filled 2012-10-29: qty 1

## 2012-10-29 NOTE — Progress Notes (Addendum)
After patient In and Out Cath himself, he was able to walk to restroom with weakness.  He is A/O x4.  He is aware that he has been confused and apologized for being combative and disrespectful. Pt. Is finally sleeping.  Call light is placed within reach and will continue to monitor patient.

## 2012-10-29 NOTE — Progress Notes (Signed)
Per MD orders pt. Is allowed to bring in his own sterilized kit for In and Out Catheterization.

## 2012-10-29 NOTE — Progress Notes (Signed)
Physical Therapy Treatment Patient Details Name: MATTISON STUCKEY MRN: [id number] DOB: 1921/12/31 Today's Date: 10/29/2012 Time: 6295-2841 PT Time Calculation (min): 25 min  PT Assessment / Plan / Recommendation  History of Present Illness KEVRON PATELLA is a 79 y.o. male presenting with AMS and UTI. PMH is significant for afib, HTN, HLD, BPH, osteoporosis, parkinsons and dementia.   PT Comments   Patient very confused and agitated with activity this am. Patient assist to EOB with Max encouragement and strategies for enticing movement employed.  Pt very reluctant and at times combative with mobility.  Refusing to ambulated, or carry out various tasks. Patient perseverating throughout session on "water" and "wetness". Patient was holding glasses in hand, as GF attempted to take glasses from patient, patient clenched up fists as if to strike. Patient also grasping hand, arm, and fingers while I was trying to assist with activities at various times throughout the session. Educated wife on technique to avoid confrontation with patient at this time. Additionally, turned on all lights, and open blinds to help with confusion. Will continue to work with patient and progress activity as tolerated. Continue to rec d/c to ST SNF upon discharge.   Follow Up Recommendations  SNF;Supervision/Assistance - 24 hour           Equipment Recommendations  None recommended by PT       Frequency Min 2X/week   Progress towards PT Goals Progress towards PT goals: Not progressing toward goals - comment  Plan Current plan remains appropriate    Precautions / Restrictions Precautions Precautions: Fall Restrictions Weight Bearing Restrictions: No   Pertinent Vitals/Pain No pain reported    Mobility  Bed Mobility Bed Mobility: Supine to Sit;Sitting - Scoot to Edge of Bed Supine to Sit: 2: Max assist Sitting - Scoot to [place] of Bed: 2: Max assist Details for Bed Mobility Assistance: Pt restricting movement,  unwilling to perform activity without increased assist and calming techniques. Pt increasingly combative  Transfers Transfers: Sit to Stand;Stand to Sit;Stand Pivot Transfers Sit to Stand: 3: Mod assist Stand to Sit: 4: Min guard;To toilet;To chair/3-in-1;With armrests;With upper extremity assist Stand Pivot Transfers: 3: Mod assist Details for Transfer Assistance: Assist for stability, MAX encouragement and cues.  increased time required and various techniques for encouragement to elicit response.  Posterior learn during activity, unable to maintain standing without assist. Ambulation/Gait Ambulation/Gait Assistance: Not tested (comment)      PT Goals (current goals can now be found in the care plan section) Acute Rehab PT Goals Patient Stated Goal: none stated Time For Goal Achievement: 11/11/12 Potential to Achieve Goals: Fair  Visit Information  Last PT Received On: 10/29/12 Assistance Needed: +1 History of Present Illness: YEHIA MCBAIN is a 79 y.o. male presenting with AMS and UTI. PMH is significant for afib, HTN, HLD, BPH, osteoporosis, parkinsons and dementia.    Subjective Data  Subjective: Very confused, perseverating on "water" and people out to get him Patient Stated Goal: none stated   Cognition  Cognition Arousal/Alertness: Awake/alert Behavior During Therapy: WFL for tasks assessed/performed Overall Cognitive Status: Impaired/Different from baseline (baseline dementia) Area of Impairment: Orientation;Attention;Following commands;Safety/judgement General Comments: GF states patient is not like this at home ever    Balance  Balance Balance Assessed: Yes Static Sitting Balance Static Sitting - Balance Support: Feet supported Static Sitting - Level of Assistance: 5: Stand by assistance Static Standing Balance Static Standing - Balance Support: During functional activity Static Standing - Level of Assistance: 5: Stand  by assistance;4: Min assist Dynamic Standing  Balance Dynamic Standing - Balance Support: During functional activity  End of Session PT - End of Session Equipment Utilized During Treatment: Gait belt Activity Tolerance: Patient tolerated treatment well Patient left: in chair;with call bell/phone within reach;with chair alarm set Nurse Communication: Mobility status   GP     Fabio Asa 10/29/2012, 2:05 PM Charlotte Crumb, PT DPT  [phone]

## 2012-10-29 NOTE — Progress Notes (Signed)
Family Medicine Teaching Service Attending Note  I interviewed and examined patient Adam Garner and reviewed their tests and x-rays.  I discussed with Dr. Richarda Blade and reviewed their note for today.  I agree with their assessment and plan.     Additionally  Currently talking rapidly and tangentially - seems agitated Reports he can not stand without falling although walked yesterday When encouraged will stand but the will seemingly voluntarily flop backwards when he knows someone will catch him.  Seems to be an acute delirium on top of chronic dementia SO is convinced does not have dementia because the neurologist told me his definitely does not.  The documented MMSE was 19/30 that visit. Would consult neurology for advice on possible parkinsons dementia - treatment and to discuss prognsis with SO Discharge telemetry and IV

## 2012-10-29 NOTE — Progress Notes (Signed)
Family Medicine Teaching Service Daily Progress Note Intern Pager: [phone]  Patient name: Adam Garner Medical record number: [id number] Date of birth: 1921/05/22 Age: 79 y.o. Gender: male  Primary Care Provider: MERRELL, DAVID, MD Consultants: none Code Status: full  Pt Overview and Major Events to Date:  10/12 - presented to ED with encephalopathy found to have UTI 10/14 - admitted with encephalopathy and UTI  Assessment and Plan: Adam Garner is a 79 y.o. male presenting with AMS and UTI. PMH is significant for afib, HTN, HLD, BPH, osteoporosis, parkinsons.   # Acute encephalopathy: negative CT and MRI on 10/12, no focal deficits to suggest stroke, most likely delirium 2/2 UTI vs worsening of some baseline dementia, more agitated overnight, got haldol 2mg  x1  - q4h neuro checks  - CBC, BMET wnl  - neuro saw on 10/13, likely multifactorial, no recs - PT/OT/SNF c/s'ed, likely will need SNF placement  # UTI: denies symptoms but altered, incontinent at baseline, UA with moderate LE and 21-50 WBCs, unclear why this failed outpatient treatment but may be due to noncompliance vs. it was adequately treated and he can't report improvement due to confusion  - UCx from 10/12 with pansensitive e. coli  - s/p rocephin IV, now on keflex 500 qid  - repeat UCx: insignificant growth   # BPH  - Continuing home Flomax and Finasteride  - post-void residual not able to be accurately measured due to incontinence but random bladder scan was and patient having frequent normal voids  # HTN: 160s-190s systolic - Patient on no medication in the outpatient setting  - PRN Hydralazine for BP >180/110  - consider starting amlodipine if persists  # Afib  - Currently rate controlled without meds. Patient being followed by [facility].  - Per Cards, given gait instability and falls patient is not a good candidate for anticoagulation  - Will continue daily ASA   FEN/GI: regular diet, saline lock IV   Prophylaxis: SQ heparin   Disposition: pending clinical improvement, anticipate d/c to SNF when bed available  Subjective: Feeling well, no complaints this am, girlfriend reports he was very agitated overnight and did not sleep at all but not was redirectable and not aggressive, she denies there was any improvement in his behavior after haldol, he is still perseverating on conspiracy theories  Objective: Temp:  [97.5 F (36.4 C)-98.3 F (36.8 C)] 97.9 F (36.6 C) (10/17 0510) Pulse Rate:  [61-109] 79 (10/17 0510) Resp:  [19-20] 19 (10/17 0510) BP: (151-194)/(76-97) 194/92 mmHg (10/17 0510) SpO2:  [93 %-98 %] 93 % (10/17 0510) Physical Exam: General: elderly man, lying in bed, NAD  HEENT: MMM, NCAT, EOMI  Cardiovascular: irregularly irregular rate, no murmur  Respiratory: CTAB, normal WOB  Abdomen: soft, +bs, nt, nd, no suprapubic tenderness  Extremities: 2+ LE edema to the mid calf  Skin: intact, no rashes  Neuro: alert and oriented x2 (not oriented to time), delirious and perseverating on conspiracy theories and thinks that phone and nurse call button control our actions, now also reporting he is going to heaven later today  Laboratory:  Recent Labs Lab 10/26/12 2220 10/26/12 2229 10/27/12 0452 10/28/12 0540  WBC 10.5  --  9.6 10.4  HGB 15.8 15.6 15.6 14.7  HCT 43.8 46.0 43.2 41.2  PLT 233  --  242 219    Recent Labs Lab 10/24/12 0840 10/26/12 1110 10/26/12 2229 10/27/12 0837 10/28/12 0540  NA 139 143 142 145 145  K 4.2 4.2 3.8  4.3 3.9  CL 104 103 108 110 109  CO2 24 19  --  26 24  BUN 27* 16 20 16 16   CREATININE 1.02 1.07 1.20 0.95 0.87  CALCIUM 10.0 9.8  --  9.3 8.7  PROT 7.3 7.0  --   --   --   BILITOT 0.8 0.7  --   --   --   ALKPHOS 62 64  --   --   --   ALT 37 31  --   --   --   AST 42* 32  --   --   --   GLUCOSE 117* 104* 118* 80 93    10/26/2012 23:54   Color, Urine  YELLOW   APPearance  CLOUDY (A)   Specific Gravity, Urine  1.019   pH  5.5    Glucose  NEGATIVE   Bilirubin Urine  NEGATIVE   Ketones, ur  NEGATIVE   Protein  NEGATIVE   Urobilinogen, UA  0.2   Nitrite  NEGATIVE   Leukocytes, UA  MODERATE (A)   Hgb urine dipstick  NEGATIVE   WBC, UA  21-50   Squamous Epithelial / LPF  RARE   Crystals  CA OXALATE CRYSTALS (A)   UCx 10/12: >100,000 colonies of E.coli, pan-sensitive UCx 10/14: 8,000 colonies, insignificant growth  Lactic acid 1.14   CXR: Cardiac silhouette appears moderately enlarged, even with consideration to this low inspiratory examination with crowded vasculature markings. No pleural effusions. No focal consolidations. No pneumothorax.   EKG: afib, no ST or T-wave changes, stable from prior   CT head: Exam is motion degraded. No intracranial hemorrhage. Global atrophy. Prominent small vessel disease type changes without CT evidence of large acute infarct.   MRI brain: Fast technique imaging had to be utilized. Sequences are motion  degraded. No acute infarct. No intracranial hemorrhage. Moderate small vessel disease type changes. Moderate global atrophy. Ventricular prominence may be related to atrophy. Difficult to completely exclude a mild component of hydrocephalus. Aqueduct is patent. No intracranial mass lesion noted on this unenhanced exam. Right vertebral artery diminutive in size. Remainder of major intracranial vascular structures are patent with tortuous left vertebral artery and basilar artery. Partially empty sella incidentally noted. Cervical medullary junction and orbital structures unremarkable.  Beverely Low, MD 10/29/2012, 8:13 AM PGY-1, [facility] Family Medicine FPTS Intern pager: [phone], text pages welcome

## 2012-10-30 DIAGNOSIS — G9340 Encephalopathy, unspecified: Secondary | ICD-10-CM

## 2012-10-30 DIAGNOSIS — N390 Urinary tract infection, site not specified: Principal | ICD-10-CM

## 2012-10-30 DIAGNOSIS — R269 Unspecified abnormalities of gait and mobility: Secondary | ICD-10-CM

## 2012-10-30 DIAGNOSIS — G609 Hereditary and idiopathic neuropathy, unspecified: Secondary | ICD-10-CM

## 2012-10-30 DIAGNOSIS — I4891 Unspecified atrial fibrillation: Secondary | ICD-10-CM

## 2012-10-30 DIAGNOSIS — R2681 Unsteadiness on feet: Secondary | ICD-10-CM | POA: Diagnosis present

## 2012-10-30 NOTE — Clinical Social Work Placement (Signed)
     Clinical Social Work Department CLINICAL SOCIAL WORK PLACEMENT NOTE 10/30/2012  Patient:  CHARON, AKAMINE  Account Number:  [id number] Admit date:  10/26/2012  Clinical Social Worker:  Carren Rang  Date/time:  10/28/2012 03:30 PM  Clinical Social Work is seeking post-discharge placement for this patient at the following level of care:   SKILLED NURSING   (*CSW will update this form in Epic as items are completed)   10/28/2012  Patient/family provided with [name] Department of Clinical Social Works list of facilities offering this level of care within the geographic area requested by the patient (or if unable, by the patients family).  10/28/2012  Patient/family informed of their freedom to choose among providers that offer the needed level of care, that participate in Medicare, Medicaid or managed care program needed by the patient, have an available bed and are willing to accept the patient.  10/28/2012  Patient/family informed of [facility] ownership interest in [facility], as well as of the fact that they are under no obligation to receive care at this facility.  PASARR submitted to EDS on 10/28/2012 PASARR number received from EDS on 10/28/2012  FL2 transmitted to all facilities in geographic area requested by pt/family on  10/28/2012 FL2 transmitted to all facilities within larger geographic area on   Patient informed that his/her managed care company has contracts with or will negotiate with  certain facilities, including the following:     Patient/family informed of bed offers received:  10/29/2012 Patient chooses bed at [facility] & REHABILITATION Physician recommends and patient chooses bed at    Patient to be transferred to [facility]ITATION on   Patient to be transferred to facility by Ambulance  Sharin Mons)  The following physician request were entered in Epic:   Additional Comments:

## 2012-10-30 NOTE — Consult Note (Signed)
Reason for Consult: Rule out Parkinson's disease.  HPI:                                                                                                                                          Adam Garner is an 79 y.o. male with a history of atrial fibrillation, macular degeneration, equivocal Parkinson's disease, BPH and osteoporosis who was admitted on 10/26/2012 for acute urinary tract infection and mental status changes. He also developed generalized weakness and worsening of his gait. His gait has been described as shuffling and posture stent. He was seen by Dr. Arbutus Leas at [facility] in May 2014. It was unclear whether he had Parkinson's disease at that point it was thought that he possibly had vascular type Parkinson's or of peptic Parkinson's disease. MRI study was recommended, but patient refused to have the study performed. Was also felt that peripheral neuropathy is contributing to his gait changes. Assistive devices have been recommended, but patient has refused to use them. Family members indicated no recent falls.  MRI study of his brain was obtained on 10/24/2012 which showed no acute intracranial abnormality, including no sign of stroke. Ventricles were prominent and atrophy with moderate. Patient has had no tremor. He also said no changes in speech or swallowing.  Past Medical History  Diagnosis Date  . Osteoporosis   . BPH (benign prostatic hyperplasia)   . Hyperlipidemia   . BPH (benign prostatic hyperplasia)   . Anemia   . Macular degeneration   . Atrial fibrillation   . Parkinsonism     Past Surgical History  Procedure Laterality Date  . Tonsilectomy/adenoidectomy with myringotomy      Family History  Problem Relation Age of Onset  . Heart disease Mother   . Heart disease Father     Social History:  reports that he has never smoked. He does not have any smokeless tobacco history on file. He reports that he does not drink alcohol or use illicit drugs.  Allergies   Allergen Reactions  . Penicillins     unknown  . Sulfonamide Derivatives     unknown    MEDICATIONS:                                                                                                                     I have reviewed the patient's current medications.   ROS:  History obtained from friends  General ROS: negative for - chills, fatigue, fever, night sweats, weight gain or weight loss Psychological ROS: Positive for confusion and agitation associated with current illness Ophthalmic ROS: negative for - blurry vision, double vision, eye pain or loss of vision ENT ROS: negative for - epistaxis, nasal discharge, oral lesions, sore throat, tinnitus or vertigo Allergy and Immunology ROS: negative for - hives or itchy/watery eyes Hematological and Lymphatic ROS: negative for - bleeding problems, bruising or swollen lymph nodes Endocrine ROS: negative for - galactorrhea, hair pattern changes, polydipsia/polyuria or temperature intolerance Respiratory ROS: negative for - cough, hemoptysis, shortness of breath or wheezing Cardiovascular ROS: negative for - chest pain, dyspnea on exertion, edema or irregular heartbeat Gastrointestinal ROS: negative for - abdominal pain, diarrhea, hematemesis, nausea/vomiting or stool incontinence Genito-Urinary ROS: negative for - dysuria, hematuria, incontinence or urinary frequency/urgency Musculoskeletal ROS: negative for - joint swelling or muscular weakness Neurological ROS: as noted in HPI Dermatological ROS: negative for rash and skin lesion changes   Blood pressure 156/77, pulse 74, temperature 98 F (36.7 C), temperature source Oral, resp. rate 18, height 5\' 7"  (1.702 m), weight 85 kg (187 lb 6.3 oz), SpO2 93.00%.   Neurologic Examination:                                                                                                       Mental Status: Alert, oriented to place but disoriented to correct date.  Patient was quite confused and tended to confabulate rather freely. Able to follow commands fairly well. Cranial Nerves: II-Visual fields were normal. III/IV/VI-Pupils were equal and reacted. Extraocular movements were full and conjugate.    V/VII-no facial numbness and no facial weakness. VIII-normal. X-normal speech and symmetrical palatal movement. Motor: 5/5 bilaterally with normal tone and bulk; equivocal increase in tone in upper extremities; no clear increase in muscle tone involving lower extremities. No tremor noted at rest. Sensory: Normal except for absent perception of vibration below the knees. Deep Tendon Reflexes: 1+ and symmetric. Plantars: Mute bilaterally Cerebellar: Normal finger-to-nose testing with very minimal action tremor bilaterally.  Lab Results  Component Value Date/Time   CHOL 215* 02/23/2009  9:19 AM    Results for orders placed during the hospital encounter of 10/26/12 (from the past 48 hour(s))  GLUCOSE, CAPILLARY     Status: Abnormal   Collection Time    10/29/12 11:52 AM      Result Value Range   Glucose-Capillary 137 (*) 70 - 99 mg/dL    No results found.   Assessment/Plan: 79 year old man presenting with acute delirium associated with acute urinary tract infection. He has chronic gait abnormality which is likely exacerbated by his current infectious process. Patient has no clear clinical signs of Parkinson's disease.  Recommend no changes in current management including continuing antibiotic management of urinary tract infection as well as physical therapy intervention regarding patient's unstable gait. I anticipate an improvement in his gait and this is infectious process subsides. No anti-Parkinson's medication is indicated.  We will plan to see him in followup.  Arnell Sieving, MD [facility] [phone]  10/30/2012, 3:53  PM

## 2012-10-30 NOTE — Progress Notes (Signed)
Bed offers provided to patient and friend Carney Bern.  Patient is alert and oriented at times but he appeared to have great difficulty focusing on d/c discussion.  They have chosen [place] and Joette Catching will authorize but need [facility] to submit clinicals. CSW spoke with Bjorn Loser- Admissions late yesterday afternoon and she stated that they would do this first thing Monday morning.  Lorri Frederick. [place]  [phone]

## 2012-10-30 NOTE — Progress Notes (Signed)
Family Medicine Teaching Service Daily Progress Note Intern Pager: [phone]  Patient name: Adam Garner Medical record number: [id number] Date of birth: 13-Jan-1922 Age: 79 y.o. Gender: male  Primary Care Provider: Shelly Flatten, MD Consultants: none Code Status: full  Pt Overview and Major Events to Date:  10/12 - presented to ED with encephalopathy found to have UTI 10/14 - admitted with encephalopathy and UTI 10/17 - d/c tele and IV, mental status still wax/wanes  Assessment and Plan: Adam Garner is a 79 y.o. male presenting with AMS and UTI. PMH is significant for afib, HTN, HLD, BPH, osteoporosis, parkinsons.   # Acute encephalopathy: negative CT and MRI on 10/12, no focal deficits to suggest stroke, most likely delirium 2/2 UTI vs worsening of some baseline dementia, more agitated overnight, got haldol 2mg  x1  - q4h neuro checks. Had episode of coherent thinking with RN last night and apologized for being combative. Significant other agrees his mental status was much clearer last night. - CBC, BMET wnl. Con't to be normal. - neuro saw on 10/13, likely multifactorial, no recs - PT/OT/SNF c/s'ed, likely will need SNF placement  # UTI: denies symptoms but altered, incontinent at baseline, UA with moderate LE and 21-50 WBCs, unclear why this failed outpatient treatment but may be due to noncompliance vs. it was adequately treated and he can't report improvement due to confusion - UCx from 10/12 with pansensitive e. coli  - s/p rocephin IV x3 days, now on keflex 500 qid (day 2) - repeat UCx: insignificant growth  - Con't I/O cath as needed - Monitor strict ins/out  # BPH  - Continuing home Flomax and Finasteride  - post-void residual not able to be accurately measured due to incontinence but random bladder scan was and patient having frequent normal voids  # HTN: Remains elevated - Patient on no medication in the outpatient setting  - PRN Hydralazine for BP >180/110  -  Started on Amlodipine yesterday.  - Monitor BP  # Afib  - Currently rate controlled without meds. Patient being followed by [facility].  - Per Cards, given gait instability and falls patient is not a good candidate for anticoagulation  - Will continue daily ASA   FEN/GI: regular diet Prophylaxis: SQ heparin   Disposition: pending further clinical improvement, anticipate d/c to SNF when bed available  Subjective: Significant other at bedside. Patient is very talkative and some of his thoughts make sense, others do not. No complaints at this time  Objective: Temp:  [97.5 F (36.4 C)-98.9 F (37.2 C)] 97.9 F (36.6 C) (10/18 0622) Pulse Rate:  [57-97] 80 (10/18 0622) Resp:  [18-20] 18 (10/18 0622) BP: (151-169)/(65-100) 161/100 mmHg (10/18 0622) SpO2:  [93 %-98 %] 96 % (10/18 0622) Physical Exam: General: elderly man, lying in bed, NAD  HEENT: MMM, NCAT Cardiovascular: irregularly irregular rate, no murmur  Respiratory: CTAB, normal WOB  Abdomen: soft, +bs, nt, nd, no suprapubic tenderness. Corky Crafts is wet. Extremities: 1-2+ LE edema to the mid calf  Skin: intact, no rashes. Large ecchymosis of left hand/forearm Neuro: alert and oriented to person, place, month (not year), situation and current events. Moves all extremities.  Laboratory:  Recent Labs Lab 10/26/12 2220 10/26/12 2229 10/27/12 0452 10/28/12 0540  WBC 10.5  --  9.6 10.4  HGB 15.8 15.6 15.6 14.7  HCT 43.8 46.0 43.2 41.2  PLT 233  --  242 219    Recent Labs Lab 10/24/12 0840 10/26/12 1110 10/26/12 2229 10/27/12 0837 10/28/12  0540  NA 139 143 142 145 145  K 4.2 4.2 3.8 4.3 3.9  CL 104 103 108 110 109  CO2 24 19  --  26 24  BUN 27* 16 20 16 16   CREATININE 1.02 1.07 1.20 0.95 0.87  CALCIUM 10.0 9.8  --  9.3 8.7  PROT 7.3 7.0  --   --   --   BILITOT 0.8 0.7  --   --   --   ALKPHOS 62 64  --   --   --   ALT 37 31  --   --   --   AST 42* 32  --   --   --   GLUCOSE 117* 104* 118* 80 93    10/26/2012  23:54   Color, Urine  YELLOW   APPearance  CLOUDY (A)   Specific Gravity, Urine  1.019   pH  5.5   Glucose  NEGATIVE   Bilirubin Urine  NEGATIVE   Ketones, ur  NEGATIVE   Protein  NEGATIVE   Urobilinogen, UA  0.2   Nitrite  NEGATIVE   Leukocytes, UA  MODERATE (A)   Hgb urine dipstick  NEGATIVE   WBC, UA  21-50   Squamous Epithelial / LPF  RARE   Crystals  CA OXALATE CRYSTALS (A)   UCx 10/12: >100,000 colonies of E.coli, pan-sensitive UCx 10/14: 8,000 colonies, insignificant growth  Lactic acid 1.14   CXR: Cardiac silhouette appears moderately enlarged, even with consideration to this low inspiratory examination with crowded vasculature markings. No pleural effusions. No focal consolidations. No pneumothorax.   EKG: afib, no ST or T-wave changes, stable from prior   CT head: Exam is motion degraded. No intracranial hemorrhage. Global atrophy. Prominent small vessel disease type changes without CT evidence of large acute infarct.   MRI brain: Fast technique imaging had to be utilized. Sequences are motion  degraded. No acute infarct. No intracranial hemorrhage. Moderate small vessel disease type changes. Moderate global atrophy. Ventricular prominence may be related to atrophy. Difficult to completely exclude a mild component of hydrocephalus. Aqueduct is patent. No intracranial mass lesion noted on this unenhanced exam. Right vertebral artery diminutive in size. Remainder of major intracranial vascular structures are patent with tortuous left vertebral artery and basilar artery. Partially empty sella incidentally noted. Cervical medullary junction and orbital structures unremarkable.  Hilarie Fredrickson, MD 10/30/2012, 9:10 AM PGY-3,  Family Medicine FPTS Intern pager: [phone], text pages welcome

## 2012-10-30 NOTE — Progress Notes (Signed)
Family Medicine Teaching Service Attending Note  I interviewed and examined patient Adam Garner and reviewed their tests and x-rays.  I discussed with Dr. Mikel Cella and reviewed their note for today.  I agree with their assessment and plan.     Additionally  Continued intermittent confusion agitation Very tangential this AM is oriented to place but not situation Likely delirium on top of dementia  Ask neurology for clarification of diagnosis and suggestions for therapy and to discuss with patient's SO

## 2012-10-31 LAB — CBC
Hemoglobin: 15.3 g/dL (ref 13.0–17.0)
MCH: 31.7 pg (ref 26.0–34.0)
MCV: 90.9 fL (ref 78.0–100.0)
Platelets: 233 10*3/uL (ref 150–400)
RBC: 4.82 MIL/uL (ref 4.22–5.81)
RDW: 13.4 % (ref 11.5–15.5)
WBC: 8.7 10*3/uL (ref 4.0–10.5)

## 2012-10-31 LAB — BASIC METABOLIC PANEL
CO2: 26 mEq/L (ref 19–32)
Chloride: 104 mEq/L (ref 96–112)
Creatinine, Ser: 1.11 mg/dL (ref 0.50–1.35)
GFR calc Af Amer: 66 mL/min — ABNORMAL LOW (ref >90)
Potassium: 3.5 mEq/L (ref 3.5–5.1)
Sodium: 140 mEq/L (ref 135–145)

## 2012-10-31 LAB — RPR: RPR Ser Ql: NONREACTIVE

## 2012-10-31 LAB — FOLATE: Folate: >20 ng/mL

## 2012-10-31 MED ORDER — AMLODIPINE BESYLATE 10 MG PO TABS
10.0000 mg | ORAL_TABLET | Freq: Every day | ORAL | Status: DC
  Administered 2012-11-01 – 2012-11-02 (×2): 10 mg via ORAL
  Filled 2012-10-31 (×2): qty 1

## 2012-10-31 MED ORDER — HALOPERIDOL LACTATE 5 MG/ML IJ SOLN
5.0000 mg | Freq: Once | INTRAMUSCULAR | Status: AC
  Administered 2012-10-31: 5 mg via INTRAVENOUS
  Filled 2012-10-31: qty 1

## 2012-10-31 MED ORDER — AMLODIPINE BESYLATE 5 MG PO TABS
5.0000 mg | ORAL_TABLET | Freq: Once | ORAL | Status: AC
  Administered 2012-10-31: 5 mg via ORAL
  Filled 2012-10-31: qty 1

## 2012-10-31 MED ORDER — HYDRALAZINE HCL 20 MG/ML IJ SOLN: 5.0000 mg | INTRAMUSCULAR | Status: DC | PRN

## 2012-10-31 NOTE — Progress Notes (Addendum)
Family Medicine Teaching Service Attending Note  I interviewed and examined patient Adam Garner and reviewed their tests and x-rays.  I discussed with Dr. Richarda Garner and reviewed their note for today.  I agree with their assessment and plan.     Additionally  Agitated and paranoid this AM Can redirect but only briefly Neurology considers UTI likely exacerbating factor.  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

## 2012-10-31 NOTE — Progress Notes (Signed)
Family Medicine Teaching Service Daily Progress Note Intern Pager: [phone]  Patient name: Adam Garner Medical record number: [id number] Date of birth: 01-16-1921 Age: 79 y.o. Gender: male  Primary Care Provider: Shelly Flatten, MD Consultants: none Code Status: full  Pt Overview and Major Events to Date:  10/12 - presented to ED with encephalopathy found to have UTI 10/14 - admitted with encephalopathy and UTI 10/17 - d/c tele and IV, mental status still wax/wanes  Assessment and Plan: Adam Garner is a 79 y.o. male presenting with AMS and UTI. PMH is significant for afib, HTN, HLD, BPH, osteoporosis, parkinsons.   # Acute encephalopathy: negative CT and MRI on 10/12, no focal deficits to suggest stroke, most likely delirium 2/2 UTI vs worsening of some baseline dementia  - q4h neuro checks. Mental status still waxing and waning, no overall improvement - CBC, BMET wnl. Con't to be normal. - neuro consulted, rec B12, folate, RPR, TSH, EEG. (ordered those not done during this admission) [ ]  f/u B12, folate, RPR and EEG - PT/OT/SNF c/s'ed, has bed offers ([facility] pending, possibly Monday)  # UTI: denies symptoms but altered, incontinent at baseline, UA with moderate LE and 21-50 WBCs, unclear why this failed outpatient treatment but may be due to noncompliance vs. it was adequately treated and he can't report improvement due to confusion - UCx from 10/12 with pansensitive e. coli  - s/p rocephin IV x3 days, now on keflex 500 qid (day 8 of total abx) - repeat UCx: insignificant growth  - Con't I/O cath as needed - Monitor strict ins/out  # BPH  - Continuing home Flomax and Finasteride  - post-void residual not able to be accurately measured due to incontinence but random bladder scan was and patient having frequent normal voids  # HTN: Remains elevated, 140s-180s systolic - Patient on no medication in the outpatient setting  - PRN Hydralazine for BP >180/110  - Started  on Amlodipine 10/17.  - Monitor BP  # Afib  - Currently rate controlled without meds. Patient being followed by [facility].  - Per Cards, given gait instability and falls patient is not a good candidate for anticoagulation  - Will continue daily ASA   FEN/GI: regular diet Prophylaxis: SQ heparin   Disposition: pending further clinical improvement, anticipate d/c to SNF Monday  Subjective: Significant other at bedside. Patient is very talkative and is confabulating about conspiracies. No complaints at this time  Objective: Temp:  [97.4 F (36.3 C)-98.3 F (36.8 C)] 98.3 F (36.8 C) (10/19 0941) Pulse Rate:  [52-75] 75 (10/19 0941) Resp:  [18-20] 20 (10/19 0941) BP: (146-188)/(70-106) 161/78 mmHg (10/19 0941) SpO2:  [93 %-99 %] 94 % (10/19 0941) Physical Exam: General: elderly man, lying in bed, NAD  HEENT: MMM, NCAT Cardiovascular: irregularly irregular rate, no murmur  Respiratory: CTAB, normal WOB  Abdomen: soft, +bs, nt, nd, no suprapubic tenderness. Extremities: trace LE edema Skin: intact, no rashes. Large ecchymosis of left hand/forearm Neuro: alert and oriented to person, place, not time, situation or current events. Moves all extremities.  Laboratory:  Recent Labs Lab 10/26/12 2220 10/26/12 2229 10/27/12 0452 10/28/12 0540  WBC 10.5  --  9.6 10.4  HGB 15.8 15.6 15.6 14.7  HCT 43.8 46.0 43.2 41.2  PLT 233  --  242 219    Recent Labs Lab 10/26/12 1110 10/26/12 2229 10/27/12 0837 10/28/12 0540  NA 143 142 145 145  K 4.2 3.8 4.3 3.9  CL 103 108 110 109  CO2 19  --  26 24  BUN 16 20 16 16   CREATININE 1.07 1.20 0.95 0.87  CALCIUM 9.8  --  9.3 8.7  PROT 7.0  --   --   --   BILITOT 0.7  --   --   --   ALKPHOS 64  --   --   --   ALT 31  --   --   --   AST 32  --   --   --   GLUCOSE 104* 118* 80 93    10/26/2012 23:54   Color, Urine  YELLOW   APPearance  CLOUDY (A)   Specific Gravity, Urine  1.019   pH  5.5   Glucose  NEGATIVE   Bilirubin Urine   NEGATIVE   Ketones, ur  NEGATIVE   Protein  NEGATIVE   Urobilinogen, UA  0.2   Nitrite  NEGATIVE   Leukocytes, UA  MODERATE (A)   Hgb urine dipstick  NEGATIVE   WBC, UA  21-50   Squamous Epithelial / LPF  RARE   Crystals  CA OXALATE CRYSTALS (A)   UCx 10/12: >100,000 colonies of E.coli, pan-sensitive UCx 10/14: 8,000 colonies, insignificant growth  Lactic acid 1.14   CXR: Cardiac silhouette appears moderately enlarged, even with consideration to this low inspiratory examination with crowded vasculature markings. No pleural effusions. No focal consolidations. No pneumothorax.   EKG: afib, no ST or T-wave changes, stable from prior   CT head: Exam is motion degraded. No intracranial hemorrhage. Global atrophy. Prominent small vessel disease type changes without CT evidence of large acute infarct.   MRI brain: Fast technique imaging had to be utilized. Sequences are motion degraded. No acute infarct. No intracranial hemorrhage. Moderate small vessel disease type changes. Moderate global atrophy. Ventricular prominence may be related to atrophy. Difficult to completely exclude a mild component of hydrocephalus. Aqueduct is patent. No intracranial mass lesion noted on this unenhanced exam. Right vertebral artery diminutive in size. Remainder of major intracranial vascular structures are patent with tortuous left vertebral artery and basilar artery. Partially empty sella incidentally noted. Cervical medullary junction and orbital structures unremarkable.  Beverely Low, MD 10/31/2012, 10:43 AM PGY-1, [facility] FPTS Intern pager: [phone], text pages welcome

## 2012-10-31 NOTE — Progress Notes (Signed)
Subjective: Patient had no complaints. He is continued to show signs of effusion, as well as slight agitation and poor judgment.  Objective: Current vital signs: BP 161/78  Pulse 75  Temp(Src) 98.3 F (36.8 C) (Oral)  Resp 20  Ht 5\' 7"  (1.702 m)  Wt 85 kg (187 lb 6.3 oz)  BMI 29.34 kg/m2  SpO2 94%  Neurologic Exam: Alert and in no acute distress. Patient was oriented to time as well as place and person. He was still quite talkative and confabulated rather freely. Speech was normal without dysarthria. Patient moved extremities equally with normal strength throughout. Coordination appeared to be normal as well.  Medications: I have reviewed the patient's current medications.  Assessment/Plan: Altered mental status with slight improvement in orientation compared to yesterday. Patient is still confused and is continually confabulating. Mental status changes likely secondary to urinary tract infection. Patient has no clear documentation of dementia prior to hospitalization. However, mild early dementia cannot be ruled out.  Recommend vitamin B12 and folate levels, RPR and TSH, if not recently obtained. Also recommend routine EEG to rule out slowing of cerebral activity.  We will continue to follow this patient with you.  C.R. Roseanne Reno, MD [facility] [phone]  10/31/2012  10:03 AM

## 2012-11-01 ENCOUNTER — Inpatient Hospital Stay (HOSPITAL_COMMUNITY): Payer: Medicare Other

## 2012-11-01 DIAGNOSIS — R0609 Other forms of dyspnea: Secondary | ICD-10-CM

## 2012-11-01 DIAGNOSIS — R4182 Altered mental status, unspecified: Secondary | ICD-10-CM

## 2012-11-01 NOTE — Progress Notes (Signed)
Pt still confused today and easily agitated. Pt against foley insertion and refused it. Pt denies any discomfort and family agrees to trying foley insertion tomorrow.

## 2012-11-01 NOTE — Progress Notes (Signed)
Physical Therapy Treatment Patient Details Name: Adam Garner MRN: [id number] DOB: February 06, 1921 Today's Date: 11/01/2012 Time: 4098-1191 PT Time Calculation (min): 16 min  PT Assessment / Plan / Recommendation  History of Present Illness Adam Garner is a 79 y.o. male presenting with AMS and UTI. PMH is significant for afib, HTN, HLD, BPH, osteoporosis, parkinsons and dementia.   PT Comments   Patient progressing much better this session than last therapy session. Patient alert and showing no agitation. Did follow patient in chair with ambulation for safety. Girlfriend present and end room with patient.   Follow Up Recommendations  SNF;Supervision/Assistance - 24 hour     Does the patient have the potential to tolerate intense rehabilitation     Barriers to Discharge        Equipment Recommendations  None recommended by PT    Recommendations for Other Services    Frequency Min 2X/week   Progress towards PT Goals Progress towards PT goals: Progressing toward goals  Plan Current plan remains appropriate    Precautions / Restrictions Precautions Precautions: Fall   Pertinent Vitals/Pain Denied pain    Mobility  Bed Mobility Supine to Sit: 4: Min guard Transfers Sit to Stand: 4: Min assist;From bed;With upper extremity assist Stand to Sit: To chair/3-in-1;With upper extremity assist Details for Transfer Assistance: A for balance. Cues for positioning Ambulation/Gait Ambulation/Gait Assistance: 4: Min assist Ambulation Distance (Feet): 200 Feet Assistive device: 1 person hand held assist Ambulation/Gait Assistance Details: Cues to increase step length. Varying steps throughout Gait Pattern: Step-through pattern;Decreased stride length;Shuffle;Narrow base of support Gait velocity: decreased    Exercises     PT Diagnosis:    PT Problem List:   PT Treatment Interventions:     PT Goals (current goals can now be found in the care plan section)    Visit Information  Last PT Received On: 11/01/12 Assistance Needed: +1 (+2 if patient confused) History of Present Illness: Adam Garner is a 79 y.o. male presenting with AMS and UTI. PMH is significant for afib, HTN, HLD, BPH, osteoporosis, parkinsons and dementia.    Subjective Data      Cognition  Cognition Arousal/Alertness: Awake/alert Behavior During Therapy: WFL for tasks assessed/performed Overall Cognitive Status: Within Functional Limits for tasks assessed    Balance     End of Session PT - End of Session Equipment Utilized During Treatment: Gait belt Activity Tolerance: Patient tolerated treatment well Patient left: in chair;with call bell/phone within reach;with chair alarm set Nurse Communication: Mobility status   GP     Fredrich Birks 11/01/2012, 2:21 PM 11/01/2012 Fredrich Birks PTA [phone] pager [phone] office

## 2012-11-01 NOTE — Progress Notes (Addendum)
Occupational Therapy Treatment Patient Details Name: Adam Garner MRN: [id number] DOB: 1921/06/22 Today's Date: 11/01/2012 Time: 4098-1191 OT Time Calculation (min): 21 min  OT Assessment / Plan / Recommendation  History of present illness Adam Garner is a 79 y.o. male presenting with AMS and UTI. PMH is significant for afib, HTN, HLD, BPH, osteoporosis, parkinsons and dementia.   OT comments  Pt performed toileting tasks and grooming at sink as well as LB bathing. Pt with decreased attention during session.   Follow Up Recommendations  SNF;Supervision/Assistance - 24 hour    Barriers to Discharge       Equipment Recommendations  3 in 1 bedside comode    Recommendations for Other Services    Frequency Min 2X/week   Progress towards OT Goals Progress towards OT goals: Progressing toward goals  Plan Discharge plan remains appropriate    Precautions / Restrictions Precautions Precautions: Fall Restrictions Weight Bearing Restrictions: No   Pertinent Vitals/Pain No pain reported.     ADL  Grooming: Wash/dry hands;Minimal assistance Where Assessed - Grooming: Unsupported standing Lower Body Bathing: Minimal assistance Where Assessed - Lower Body Bathing: Supported sit to stand Lower Body Dressing: Supervision/safety (socks) Where Assessed - Lower Body Dressing: Supported sitting Toilet Transfer: Hydrographic surveyor Method: Sit to Barista: Extra wide bedside commode Toileting - Clothing Manipulation and Hygiene: Minimal assistance Where Assessed - Engineer, mining and Hygiene: Sit to stand from 3-in-1 or toilet Equipment Used: Gait belt;Rolling walker Transfers/Ambulation Related to ADLs: Min guard/Min A ADL Comments: Pt was on 3 in 1 when OT arrived. Assisted with clothing management. Pt had wet gown, so had pt wash off bottom and legs with washcloth. Pt with decreased balance while at sink performing grooming. Pt able to  don/doff socks while sitting in chair. Pt with decreased attention. Educated pt and significant other on pressure relief technique by leaning side to side in chair.    OT Diagnosis:    OT Problem List:   OT Treatment Interventions:     OT Goals(current goals can now be found in the care plan section) Acute Rehab OT Goals Patient Stated Goal: none stated OT Goal Formulation: With patient Time For Goal Achievement: 11/04/12 Potential to Achieve Goals: Good ADL Goals Pt Will Perform Grooming: with modified independence;standing Pt Will Perform Upper Body Bathing: with modified independence;sitting Pt Will Perform Lower Body Bathing: with modified independence;sit to/from stand Pt Will Perform Upper Body Dressing: with modified independence;sitting Pt Will Perform Lower Body Dressing: with modified independence;sit to/from stand Pt Will Transfer to Toilet: with modified independence;ambulating Pt Will Perform Toileting - Clothing Manipulation and hygiene: with modified independence;sit to/from stand  Visit Information  Last OT Received On: 11/01/12 Assistance Needed: +1 (+2 if pt confused) History of Present Illness: Adam Garner is a 79 y.o. male presenting with AMS and UTI. PMH is significant for afib, HTN, HLD, BPH, osteoporosis, parkinsons and dementia.    Subjective Data      Prior Functioning       Cognition  Cognition Arousal/Alertness: Awake/alert Behavior During Therapy: WFL for tasks assessed/performed Overall Cognitive Status:  (unsure of baseline) Area of Impairment: Attention;Safety/judgement;Memory Current Attention Level: Sustained Memory: Decreased short-term memory Safety/Judgement: Decreased awareness of safety General Comments: talking a lot during session     Mobility  Bed Mobility Bed Mobility: Not assessed Transfers Transfers: Sit to Stand;Stand to Sit Sit to Stand: 4: Min assist;4: Min guard;With upper extremity assist;From chair/3-in-1 Stand to  Sit:  4: Min guard;4: Min assist;With upper extremity assist;To chair/3-in-1 Details for Transfer Assistance: Assist to rise from recliner chair on one attempt and to control descent to chair    Exercises      Balance     End of Session OT - End of Session Equipment Utilized During Treatment: Gait belt;Rolling walker Activity Tolerance: Patient tolerated treatment well Patient left: in chair;with call bell/phone within reach;with family/visitor present;with chair alarm set Nurse Communication: Mobility status;Other (comment) (saw [place] walking in hallway)  GO     Earlie Raveling OTR/L [phone] 11/01/2012, 4:42 PM

## 2012-11-01 NOTE — Progress Notes (Signed)
Family Medicine Teaching Service Daily Progress Note Intern Pager: [phone]  Patient name: Adam Garner [place] Medical record number: [id number] Date of birth: 06-01-1921 Age: 79 y.o. Gender: male  Primary Care Provider: Shelly Flatten, MD Consultants: none Code Status: full  Pt Overview and Major Events to Date:  10/12 - presented to ED with encephalopathy found to have UTI 10/14 - admitted with encephalopathy and UTI 10/17 - d/c tele and IV, mental status still wax/wanes  Assessment and Plan: Adam Garner is a 79 y.o. male presenting with AMS and UTI. PMH is significant for afib, HTN, HLD, BPH, osteoporosis, parkinsons.   # Acute encephalopathy: negative CT and MRI on 10/12, no focal deficits to suggest stroke, most likely delirium 2/2 UTI vs worsening of some baseline dementia  - q4h neuro checks. Mental status still waxing and waning, modest overall improvement - CBC, BMET wnl. Con't to be normal. - neuro consulted, no further recs, RPR, B12, folate negative [ ]  f/u EEG, done this am - PT/OT/SNF c/s'ed, has bed offers [facility] pending, possibly today)  # UTI: denies symptoms but altered, incontinent at baseline, UA with moderate LE and 21-50 WBCs, unclear why this failed outpatient treatment but may be due to noncompliance vs. it was adequately treated and he can't report improvement due to confusion - UCx from 10/12 with pansensitive e. coli  - s/p rocephin IV x3 days, now on keflex 500 qid (day 9 of total abx) - repeat UCx: insignificant growth  - Con't I/O cath as needed - Monitor strict ins/out - rechecked PVR today -  # BPH  - Continuing home Flomax and Finasteride  - post-void residual not able to be accurately measured due to incontinence but random bladder scan was and patient having frequent normal voids  # HTN: Remains elevated, 140s-170s systolic - Patient on no medication in the outpatient setting  - PRN Hydralazine for BP >200/120  - Increased  Amlodipine 10/19.  - Monitor BP  # Afib  - Currently rate controlled without meds. Patient being followed by [place].  - Per Cards, given gait instability and falls patient is not a good candidate for anticoagulation  - Will continue daily ASA   FEN/GI: regular diet Prophylaxis: SQ heparin   Disposition: pending further clinical improvement, anticipate d/c to SNF today  Subjective: Significant other at bedside. Patient is very talkative and is confabulating about conspiracies. Seems calmer and clearer than on prior days. No complaints at this time  Objective: Temp:  [97.2 F (36.2 C)-98.8 F (37.1 C)] 98.5 F (36.9 C) (10/20 1235) Pulse Rate:  [44-89] 81 (10/20 1235) Resp:  [18-20] 18 (10/20 0600) BP: (143-189)/(65-103) 143/93 mmHg (10/20 1235) SpO2:  [94 %-97 %] 96 % (10/20 1235) Physical Exam: General: elderly man, lying in bed, NAD  HEENT: MMM, NCAT Cardiovascular: irregularly irregular rate, no murmur  Respiratory: CTAB, normal WOB  Abdomen: soft, +bs, nt, nd, no suprapubic tenderness. Extremities: trace LE edema Skin: intact, no rashes. Large ecchymosis of left hand/forearm Neuro: alert and oriented to person, place, not time. Moves all extremities purposefully.  Laboratory:  Recent Labs Lab 10/27/12 0452 10/28/12 0540 10/31/12 1140  WBC 9.6 10.4 8.7  HGB 15.6 14.7 15.3  HCT 43.2 41.2 43.8  PLT 242 219 233    Recent Labs Lab 10/26/12 1110  10/27/12 0837 10/28/12 0540 10/31/12 1140  NA 143  < > 145 145 140  K 4.2  < > 4.3 3.9 3.5  CL 103  < >  110 109 104  CO2 19  --  26 24 26   BUN 16  < > 16 16 16   CREATININE 1.07  < > 0.95 0.87 1.11  CALCIUM 9.8  --  9.3 8.7 9.3  PROT 7.0  --   --   --   --   BILITOT 0.7  --   --   --   --   ALKPHOS 64  --   --   --   --   ALT 31  --   --   --   --   AST 32  --   --   --   --   GLUCOSE 104*  < > 80 93 109*  < > = values in this interval not displayed.  10/26/2012 23:54   Color, Urine  YELLOW   APPearance   CLOUDY (A)   Specific Gravity, Urine  1.019   pH  5.5   Glucose  NEGATIVE   Bilirubin Urine  NEGATIVE   Ketones, ur  NEGATIVE   Protein  NEGATIVE   Urobilinogen, UA  0.2   Nitrite  NEGATIVE   Leukocytes, UA  MODERATE (A)   Hgb urine dipstick  NEGATIVE   WBC, UA  21-50   Squamous Epithelial / LPF  RARE   Crystals  CA OXALATE CRYSTALS (A)   UCx 10/12: >100,000 colonies of E.coli, pan-sensitive UCx 10/14: 8,000 colonies, insignificant growth  Lactic acid 1.14  Folate >20 B12 679 RPR nonreactive  CXR: Cardiac silhouette appears moderately enlarged, even with consideration to this low inspiratory examination with crowded vasculature markings. No pleural effusions. No focal consolidations. No pneumothorax.   EKG: afib, no ST or T-wave changes, stable from prior   CT head: Exam is motion degraded. No intracranial hemorrhage. Global atrophy. Prominent small vessel disease type changes without CT evidence of large acute infarct.   MRI brain: Fast technique imaging had to be utilized. Sequences are motion degraded. No acute infarct. No intracranial hemorrhage. Moderate small vessel disease type changes. Moderate global atrophy. Ventricular prominence may be related to atrophy. Difficult to completely exclude a mild component of hydrocephalus. Aqueduct is patent. No intracranial mass lesion noted on this unenhanced exam. Right vertebral artery diminutive in size. Remainder of major intracranial vascular structures are patent with tortuous left vertebral artery and basilar artery. Partially empty sella incidentally noted. Cervical medullary junction and orbital structures unremarkable.  Beverely Low, MD 11/01/2012, 1:44 PM PGY-1, [facility] Family Medicine FPTS Intern pager: [phone], text pages welcome

## 2012-11-01 NOTE — Progress Notes (Signed)
Pt was bladder scanned after voiding. Pt retained 184 ml. MD made aware.

## 2012-11-01 NOTE — Progress Notes (Signed)
FMTS Attending Daily Note:  Renold Don MD  [phone] pager  Family Practice pager:  [phone] I have discussed this patient with the resident Dr. Richarda Blade.  I agree with her findings as per her daily progress note.  For DC to SNF today.

## 2012-11-01 NOTE — Progress Notes (Signed)
Pt was bladder scanned after voiding. Pt retained 184 ml. MD made aware. Pt denies any discomfort or pressure. Pt remains confused at times and has went down for scheduled testing.

## 2012-11-01 NOTE — Procedures (Signed)
ELECTROENCEPHALOGRAM REPORT   Patient: [name]       Room #: 4N14 EEG No. ID: [id number] Age: 79 y.o.        Sex: male Referring Physician: Reginold Agent Report Date:  11/01/2012        Interpreting Physician: Aline Brochure  History: Adam Garner is an 79 y.o. male with altered mental status with confusion with frequent confabulations. There is concern that patient may have possible early dementia.  Indications for study:  Rule out slowing of cerebral activity.  Technique: This is an 18 channel routine scalp EEG performed at the bedside with bipolar and monopolar montages arranged in accordance to the international 10/20 system of electrode placement.   Description: This EEG recording was performed during wakefulness.  Activity consisted of diffuse low amplitude symmetrical 1-2 Hz irregular delta activity with superimposed diffuse 15-20 Hz beta activity. Photic stimulation was not performed. Hyperventilation was not performed. No epileptiform discharges were recorded. There were no areas of disproportionate focal slowing.  Interpretation: This EEG is abnormal with mild generalized nonspecific continuous slowing of cerebral activity. This pattern of slowing can be seen with degenerative as well as metabolic encephalopathy. Diffuse theta activity can be seen with use of sedating medications, most often with benzodiazepines. No evidence of an epileptic disorder was seen.  Venetia Maxon M.D. [facility] [phone]

## 2012-11-01 NOTE — Progress Notes (Signed)
EEG completed; results pending.    

## 2012-11-01 NOTE — Progress Notes (Addendum)
Patient for SNF at d/c- I have submitted updated clinicals for this patient to [facility] for auth- SNF bed accepted at [facility] as of Friday- will update as auth rec'd for transfer- Also, anticipate will need OT eval for SNF Berkley Harvey- MD paged to ask for OT order- Reece Levy, MSW, Theresia Majors [phone]

## 2012-11-01 NOTE — Progress Notes (Signed)
NEURO HOSPITALIST PROGRESS NOTE   SUBJECTIVE:                                                                                                                        Patient remains confused.  He knows he is in the hospital but cannot tell me year or month. He is perseverating on th "green button which controls the TV".    OBJECTIVE:                                                                                                                           Vital signs in last 24 hours: Temp:  [97.2 F (36.2 C)-98.8 F (37.1 C)] 97.2 F (36.2 C) (10/20 1005) Pulse Rate:  [44-89] 86 (10/20 1005) Resp:  [18-20] 18 (10/20 0600) BP: (145-189)/(65-103) 189/103 mmHg (10/20 1005) SpO2:  [94 %-97 %] 97 % (10/20 1005)  Intake/Output from previous day:   Intake/Output this shift: Total I/O In: 360 [P.O.:360] Out: 100 [Urine:100] Nutritional status: General  Past Medical History  Diagnosis Date  . Osteoporosis   . BPH (benign prostatic hyperplasia)   . Hyperlipidemia   . BPH (benign prostatic hyperplasia)   . Anemia   . Macular degeneration   . Atrial fibrillation   . Parkinsonism       Neurologic Exam:  Mental Status: Alert, not oriented to date and year.  perseverating on nursing remote and green button on the remote.  Becomes agitated easily.   Speech fluent without evidence of aphasia.  Able to follow simple step commands without difficulty. Cranial Nerves: II: Visual fields grossly normal, pupils equal, round, reactive to light and accommodation III,IV, VI: ptosis not present, extra-ocular motions intact bilaterally V,VII: smile symmetric, facial light touch sensation normal bilaterally VIII: hearing normal bilaterally IX,X: gag reflex present XI: bilateral shoulder shrug XII: midline tongue extension without atrophy or fasciculations  Motor: Right : Upper extremity   5/5    Left:     Upper extremity   5/5  Lower extremity    5/5     Lower extremity   5/5 Tone and bulk:normal tone throughout; no atrophy noted Sensory: Pinprick and light touch intact throughout, bilaterally Deep Tendon Reflexes:  Right: Upper Extremity  Left: Upper extremity   biceps (C-5 to C-6) 2/4   biceps (C-5 to C-6) 2/4 tricep (C7) 2/4    triceps (C7) 2/4 Brachioradialis (C6) 2/4  Brachioradialis (C6) 2/4  Lower Extremity Lower Extremity  quadriceps (L-2 to L-4) 0/4   quadriceps (L-2 to L-4) 0/4 Achilles (S1) 0/4   Achilles (S1) 0/4     Lab Results: Lab Results  Component Value Date/Time   CHOL 215* 02/23/2009  9:19 AM   Lipid Panel No results found for this basename: CHOL, TRIG, HDL, CHOLHDL, VLDL, LDLCALC,  in the last 72 hours  Studies/Results: No results found.  MEDICATIONS                                                                                                                        Scheduled: . amLODipine  10 mg Oral Daily  . aspirin  81 mg Oral Daily  . cephALEXin  500 mg Oral TID AC & HS  . finasteride  5 mg Oral Daily  . heparin  5,000 Units Subcutaneous Q8H  . tamsulosin  0.8 mg Oral QPC supper    ASSESSMENT/PLAN:                                                                                                            Altered mental status likely secondary to UTI. vitamin B12 and folate levels, RPR and TSH all normal.  EEG pending. Will continue to follow EEG.  IF negative neurology will S/O.   Assessment and plan discussed with with attending physician and they are in agreement.    Felicie Morn PA-C [facility] [phone]  11/01/2012, 10:29 AM

## 2012-11-02 MED ORDER — AMLODIPINE BESYLATE 10 MG PO TABS: 10.0000 mg | ORAL_TABLET | Freq: Every day | ORAL | Status: DC

## 2012-11-02 MED ORDER — HALOPERIDOL 10 MG PO TABS: 10.0000 mg | ORAL_TABLET | Freq: Every day | ORAL | Status: DC | PRN

## 2012-11-02 MED ORDER — TRAZODONE HCL 50 MG PO TABS: 50.0000 mg | ORAL_TABLET | Freq: Every evening | ORAL | Status: DC | PRN

## 2012-11-02 MED ORDER — HALOPERIDOL LACTATE 5 MG/ML IJ SOLN
5.0000 mg | Freq: Once | INTRAMUSCULAR | Status: AC
  Administered 2012-11-02: 5 mg via INTRAVENOUS
  Filled 2012-11-02: qty 1

## 2012-11-02 NOTE — Progress Notes (Signed)
Pt discharged to [facility]  report called to receiving facility and spoke with Rande Lawman the nurse pt condition stable though pt remained confused which is his baseline.. Awaiting [facility]  transport for pick up

## 2012-11-02 NOTE — Progress Notes (Signed)
Clinical Social Work Department CLINICAL SOCIAL WORK PLACEMENT NOTE 11/02/2012  Patient:  Adam Garner, Adam Garner  Account Number:  [id number] Admit date:  10/26/2012  Clinical Social Worker:  Carren Rang  Date/time:  10/28/2012 03:30 PM  Clinical Social Work is seeking post-discharge placement for this patient at the following level of care:   SKILLED NURSING   (*CSW will update this form in Epic as items are completed)   10/28/2012  Patient/family provided with [name] Department of Clinical Social Work's list of facilities offering this level of care within the geographic area requested by the patient (or if unable, by the patient's family).  10/28/2012  Patient/family informed of their freedom to choose among providers that offer the needed level of care, that participate in Medicare, Medicaid or managed care program needed by the patient, have an available bed and are willing to accept the patient.  10/28/2012  Patient/family informed of [facility]' ownership interest in [facility], as well as of the fact that they are under no obligation to receive care at this facility.  PASARR submitted to E[employer] on 10/28/2012 PASARR number received from [employer] on 10/28/2012  FL2 transmitted to all facilities in geographic area requested by pt/family on  10/28/2012 FL2 transmitted to all facilities within larger geographic area on   Patient informed that his/her managed care company has contracts with or will negotiate with  certain facilities, including the following:     Patient/family informed of bed offers received:  10/29/2012 Patient chooses bed at [facility] Physician recommends and patient chooses bed at    Patient to be transferred to [facility] on   Patient to be transferred to facility by Ambulance  Sharin Mons)  The following physician request were entered in Epic:   Additional Comments: Reece Levy, MSW,  Theresia Majors [phone]

## 2012-11-02 NOTE — Progress Notes (Signed)
Advised patient and friend Carney Bern at bedside that [facility] is reviewing SNF and will give determination once it is reviewed by their medical team. Carney Bern is adamant that he needs it and that it be covered- however I have explained to her that [facility] has their own guidelines and will have to approve coverage of SNF prior to d/c to SNF- alerted them if it is denied for SNF auth, patient will have to consider paying out of pocket for SNF and/or d/c to home with [facility]. Will update once word is rec'd.  Reece Levy, MSW, Theresia Majors [phone]

## 2012-11-02 NOTE — Progress Notes (Signed)
UR complete.  Macarena Langseth RN, MSN 

## 2012-11-02 NOTE — Progress Notes (Signed)
I have just rec'd SNF auth from [place] for [facility]- paging MD to advise- anticipate d/c today.  Reece Levy, MSW, Theresia Majors [phone]

## 2012-11-02 NOTE — Progress Notes (Signed)
Pt had a dose of Trazdone at bed time,but continous to be very confused and restless. Notified MD, Received an order of Halodol. Halodol given but didn't do much hetlp

## 2012-11-03 ENCOUNTER — Non-Acute Institutional Stay (SKILLED_NURSING_FACILITY): Payer: Medicare Other | Admitting: Internal Medicine

## 2012-11-03 ENCOUNTER — Encounter: Payer: Self-pay | Admitting: Internal Medicine

## 2012-11-03 DIAGNOSIS — N401 Enlarged prostate with lower urinary tract symptoms: Secondary | ICD-10-CM | POA: Insufficient documentation

## 2012-11-03 DIAGNOSIS — I10 Essential (primary) hypertension: Secondary | ICD-10-CM

## 2012-11-03 DIAGNOSIS — R339 Retention of urine, unspecified: Secondary | ICD-10-CM

## 2012-11-03 DIAGNOSIS — N390 Urinary tract infection, site not specified: Secondary | ICD-10-CM

## 2012-11-03 DIAGNOSIS — G9340 Encephalopathy, unspecified: Secondary | ICD-10-CM

## 2012-11-03 NOTE — Assessment & Plan Note (Signed)
And delusional requiring tx with haldol;felt initially due to concurrent UTI

## 2012-11-03 NOTE — Assessment & Plan Note (Signed)
Treatment completed in hospital with IV rocephin then keflex for total 10 days, completed.

## 2012-11-03 NOTE — Assessment & Plan Note (Signed)
Continue proscar  And flomax;was supposed to be d/c with foley but he wasn't;will place one if needed

## 2012-11-03 NOTE — Assessment & Plan Note (Signed)
Was uncontrolled in hospital so norvasc and will be continued

## 2012-11-03 NOTE — Progress Notes (Signed)
MRN: [id number] Name: Lennart Gladish Biehler  Sex: male Age: 79 y.o. DOB: 1922-01-02  PSC #: Son[facility] Dandy Facility/Room: 214A Level Of Care: SNF Provider: Merrilee Seashore D Emergency Contacts: Extended Emergency Contact Information Primary Emergency Contact: Haupt,Jeane Address: [address]          [place], [place] [address] [place] Home Phone: [phone] Relation: Friend  Code Status: DNR  Allergies: Penicillins and Sulfonamide derivatives  Chief Complaint  Patient presents with  . nursing home admission    HPI: Patient is 79 y.o. male who had MS change due to UTI and is admitted for PT/OT.  Past Medical History  Diagnosis Date  . Osteoporosis   . BPH (benign prostatic hyperplasia)   . Hyperlipidemia   . BPH (benign prostatic hyperplasia)   . Anemia   . Macular degeneration   . Atrial fibrillation   . Parkinsonism     Past Surgical History  Procedure Laterality Date  . Tonsilectomy/adenoidectomy with myringotomy        Medication List       This list is accurate as of: 11/03/12 11:33 AM.  Always use your most recent med list.               amLODipine 10 MG tablet  Commonly known as:  NORVASC  Take 1 tablet (10 mg total) by mouth daily.     aspirin 81 MG tablet  Take 81 mg by mouth daily.     finasteride 5 MG tablet  Commonly known as:  PROSCAR  Take 5 mg by mouth daily.     fish oil-omega-3 fatty acids 1000 MG capsule  Take 1 g by mouth 2 (two) times daily.     haloperidol 10 MG tablet  Commonly known as:  HALDOL  Take 1 tablet (10 mg total) by mouth daily as needed (Agitation/aggression).     multivitamin with minerals Tabs tablet  Take 1 tablet by mouth daily.     tamsulosin 0.4 MG Caps capsule  Commonly known as:  FLOMAX  Take 0.8 mg by mouth daily after supper.     traZODone 50 MG tablet  Commonly known as:  DESYREL  Take 1 tablet (50 mg total) by mouth at bedtime as needed for sleep.        No orders of the defined  types were placed in this encounter.    Immunization History  Administered Date(s) Administered  . Influenza Whole 10/14/2006, 09/28/2009  . Pneumococcal Polysaccharide 01/14/2000  . Td 01/14/2000    History  Substance Use Topics  . Smoking status: Never Smoker   . Smokeless tobacco: Not on file  . Alcohol Use: No     Comment: none    Family history is noncontributory    Review of Systems  DATA OBTAINED: from patient, and family-NO CONCERNS EXCEPT WOULD LIKE PT TO BE IN A PRIVATE ROOM GENERAL: Feels well no fevers, fatigue, appetite changes SKIN: No itching, rash or wounds EYES: No eye pain, redness, discharge EARS: No earache, tinnitus, change in hearing NOSE: No congestion, drainage or bleeding  MOUTH/THROAT: No mouth or tooth pain, No sore throat, No difficulty chewing or swallowing  RESPIRATORY: No cough, wheezing, SOB CARDIAC: No chest pain, palpitations, lower extremity edema  GI: No abdominal pain, No N/V/D or constipation, No heartburn or reflux  GU: No dysuria, frequency or urgency, or incontinence  MUSCULOSKELETAL: No unrelieved bone/joint pain NEUROLOGIC: No headache, dizziness or focal weakness PSYCHIATRIC: No overt anxiety or sadness. Sleeps well. No  behavior issue.   Filed Vitals:   11/03/12 1119  BP: 128/74  Pulse: 64  Temp: 97.4 F (36.3 C)  Resp: 18    Physical Exam  GENERAL APPEARANCE: Alert, conversant. Appropriately groomed. No acute distress.SLEEPY  SKIN: No diaphoresis rash, or wounds HEAD: Normocephalic, atraumatic  EYES: Conjunctiva/lids clear. Pupils round, reactive. EOMs intact.  EARS: External exam WNL, canals clear. Hearing grossly normal.  NOSE: No deformity or discharge.  MOUTH/THROAT: Lips w/o lesions. RESPIRATORY: Breathing is even, unlabored. Lung sounds are clear   CARDIOVASCULAR: Heartirreg no murmurs, rubs or gallops. No peripheral edema.  GASTROINTESTINAL: Abdomen is soft, non-tender, not distended w/ normal bowel  sounds. GENITOURINARY: Bladder non tender, not distended  MUSCULOSKELETAL: No abnormal joints or musculature NEUROLOGIC: Oriented X3. Cranial nerves 2-12 grossly intact. Moves all extremities no tremor. PSYCHIATRIC: Mood and affect appropriate to situation, no behavioral issues  Patient Active Problem List   Diagnosis Date Noted  . BPH (benign prostatic hypertrophy) with urinary retention 11/03/2012  . Parkinsonism   . Unstable gait 10/30/2012  . Acute encephalopathy 10/27/2012  . UTI (lower urinary tract infection) 10/27/2012  . Peripheral neuropathy 06/08/2012  . At high risk for falls 05/10/2012  . A-fib 04/22/2012  . DOE (dyspnea on exertion) 04/01/2012  . Other malaise and fatigue 03/23/2012  . Balance problem 11/19/2010  . Nonallopathic lesion of cervicothoracic region 11/02/2010  . Nonallopathic lesion of thoracic region 11/02/2010  . URINARY URGENCY 02/28/2009  . HYPERLIPIDEMIA 11/10/2007  . ANEMIA 11/10/2007  . BENIGN PROSTATIC HYPERTROPHY, MILD, HX OF 02/10/2007  . OSTEOPOROSIS 10/14/2006  . Hypertension 10/14/2006    CBC    Component Value Date/Time   WBC 8.7 10/31/2012 1140   WBC 9.0 10/26/2012 1110   RBC 4.82 10/31/2012 1140   RBC 5.17 10/26/2012 1110   HGB 15.3 10/31/2012 1140   HCT 43.8 10/31/2012 1140   PLT 233 10/31/2012 1140   MCV 90.9 10/31/2012 1140   LYMPHSABS 2.0 10/26/2012 2220   LYMPHSABS 1.9 10/26/2012 1110   MONOABS 1.0 10/26/2012 2220   EOSABS 0.1 10/26/2012 2220   EOSABS 0.1 10/26/2012 1110   BASOSABS 0.0 10/26/2012 2220   BASOSABS 0.0 10/26/2012 1110    CMP     Component Value Date/Time   NA 140 10/31/2012 1140   NA 143 10/26/2012 1110   K 3.5 10/31/2012 1140   CL 104 10/31/2012 1140   CO2 26 10/31/2012 1140   GLUCOSE 109* 10/31/2012 1140   GLUCOSE 104* 10/26/2012 1110   BUN 16 10/31/2012 1140   BUN 16 10/26/2012 1110   CREATININE 1.11 10/31/2012 1140   CREATININE 1.05 02/13/2011 1017   CALCIUM 9.3 10/31/2012 1140   PROT 7.0  10/26/2012 1110   PROT 7.3 10/24/2012 0840   ALBUMIN 4.0 10/24/2012 0840   AST 32 10/26/2012 1110   ALT 31 10/26/2012 1110   ALKPHOS 64 10/26/2012 1110   BILITOT 0.7 10/26/2012 1110   GFRNONAA 57* 10/31/2012 1140   GFRAA 66* 10/31/2012 1140    Assessment and Plan  Acute encephalopathy And delusional requiring tx with haldol;felt initially due to concurrent UTI  UTI (lower urinary tract infection) Treatment completed in hospital with IV rocephin then keflex for total 10 days, completed.  Hypertension Was uncontrolled in hospital so norvasc and will be continued  BPH (benign prostatic hypertrophy) with urinary retention Continue proscar  And flomax;was supposed to be d/c with foley but he wasn't;will place one if needed    Margit Hanks, MD

## 2012-11-04 NOTE — Discharge Summary (Signed)
Family Medicine Teaching Service  Discharge Note : Attending Jeff Donelda Mailhot MD Pager [phone] Inpatient Team Pager:  [phone]  I have reviewed this patient and the patient's chart and have discussed discharge planning with the resident at the time of discharge. I agree with the discharge plan as above.    

## 2012-11-18 ENCOUNTER — Non-Acute Institutional Stay (INDEPENDENT_AMBULATORY_CARE_PROVIDER_SITE_OTHER): Payer: PRIVATE HEALTH INSURANCE | Admitting: Family Medicine

## 2012-11-18 DIAGNOSIS — R269 Unspecified abnormalities of gait and mobility: Secondary | ICD-10-CM

## 2012-11-18 DIAGNOSIS — I10 Essential (primary) hypertension: Secondary | ICD-10-CM

## 2012-11-18 DIAGNOSIS — R339 Retention of urine, unspecified: Secondary | ICD-10-CM

## 2012-11-18 DIAGNOSIS — N138 Other obstructive and reflux uropathy: Secondary | ICD-10-CM

## 2012-11-18 DIAGNOSIS — R2681 Unsteadiness on feet: Secondary | ICD-10-CM

## 2012-11-18 DIAGNOSIS — N401 Enlarged prostate with lower urinary tract symptoms: Secondary | ICD-10-CM

## 2012-11-18 DIAGNOSIS — G9340 Encephalopathy, unspecified: Secondary | ICD-10-CM

## 2012-11-18 NOTE — Assessment & Plan Note (Signed)
Possibly related to Parkinsonian symptoms? Will follow up with his outpatient neurologist as scheduled.

## 2012-11-18 NOTE — Assessment & Plan Note (Signed)
Improved. Continue to monitor. He does have some degree of dementia as well, and does need an MMSE when time allows. Consider starting Aricept and/or Namena.

## 2012-11-18 NOTE — Assessment & Plan Note (Signed)
Stable. Con't Norvasc.

## 2012-11-18 NOTE — Progress Notes (Signed)
Patient ID: Adam Garner, male   DOB: [birth year], 79 y.o.   MRN: [id number]  [name]  Nursing Home Admission Note [name] [name], MD Phone: [phone]   Subjective: HPI: Patient is a 79 y.o. male currently residing at [facility], admitted to [facility] after hospitalization for acute encephalopathy. Since patient is a [facility] primary patient, we will assume patient's care throughout the remainder of his time at [facility]. Patient was seen while working with OT today. 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 His girlfriend was also present in patient's room. She was unable to speak with me at the time I visited due to another telephone call, but did state she was unsure if this would be short term rehab or long term stay. He was hospitalized from 10/26/12-11/02/12  Acute Encephalopathy- Thought to be primarily due to complicated UTI. Patient completed adequate treatment and has since done well. Haldol 10mg  prn for agitation, although if truly has diagnosis of Parkinson's, this should be limited. Trazodone at night time.  BPH- He has history of BPH and requires I/O cath from time to time but does not have indwelling foley catheter. Continue on Flomax daily, as well as Proscar. Will follow up with his urologist, and I will confirm that this is the care.  HTN- History of HTN. Well controlled on Norvac. Con't ASA 81mg .  Weakness- Working with PT/OT and has had good success. Patient has been informally diagnosed with Parkinson's, and although the neurology consult inpatient did not clearly state his diagnosis, patient is followed as an outpatient and should continue to see them.  History Reviewed: Never smoker.  ROS: Please see HPI above.  Objective: Nursing Home vital signs reviewed.  *Will need more thorough exam when patient is available*  Physical Examination:  General: Awake, alert. NAD. Sitting up in OT room. Talkative and  interactive HEENT: Atraumatic, normocephalic. MMM Neck: No masses palpated. No LAD Abdomen:+BS, soft, nontender, nondistended Extremities: Moves all extremities Neuro: Grossly intact  Assessment: 79 y.o. male Nursing Home resident  Plan: See Problem List

## 2012-11-18 NOTE — Assessment & Plan Note (Signed)
Does not currently have foley, and seems to be doing well. Will monitor for signs of retention. F/u with [facility] as scheduled.

## 2012-11-26 ENCOUNTER — Non-Acute Institutional Stay: Payer: PRIVATE HEALTH INSURANCE | Admitting: Family Medicine

## 2012-11-26 DIAGNOSIS — Z87898 Personal history of other specified conditions: Secondary | ICD-10-CM

## 2012-11-26 DIAGNOSIS — I4891 Unspecified atrial fibrillation: Secondary | ICD-10-CM

## 2012-11-26 DIAGNOSIS — R2681 Unsteadiness on feet: Secondary | ICD-10-CM

## 2012-11-26 DIAGNOSIS — I10 Essential (primary) hypertension: Secondary | ICD-10-CM

## 2012-11-26 DIAGNOSIS — R269 Unspecified abnormalities of gait and mobility: Secondary | ICD-10-CM

## 2012-11-26 DIAGNOSIS — G9340 Encephalopathy, unspecified: Secondary | ICD-10-CM

## 2012-11-26 NOTE — Assessment & Plan Note (Signed)
Improved from time of discharge. Will continue to monitor clinically. -Resident physician to complete Mini-Mental Status Examination

## 2012-11-26 NOTE — Assessment & Plan Note (Signed)
Well-controlled. -Continue current dose of Norvasc.

## 2012-11-26 NOTE — Progress Notes (Signed)
Patient ID: Felicie Morn, male   DOB: 03/12/1921, 79 y.o.   MRN: [id number] Family Medicine Teaching Service Nursing Home Admission Note  Patient name: SHIHAB STATES Medical record number: [id number] Date of birth: [birth year] Age: 79 y.o. Gender: male  Primary Care Provider: Shelly Flatten, MD  Chief Complaint: 79 year old male admitted to [facility] nursing home on 11/02/2012.  History of Present Illness: ANDRU GENTER is a 79 y.o. year old male [facility] after hospitalization for complicated UTI and encephalopathy. Patient's past medical history is outlined below. Patient was initially admitted to [facility] senior care. As Mr. Shomaker is followed at [facility] care was transferred to our service on 11/18/2012. Please refer to resident dictation on 11/18/2012 for additional history of present illness. This evaluation was completed on 11/25/12.  The patient is currently in good spirits. He is seen in his nursing home room. His girlfriend is present as well as the physical therapist. His main complaint today is that he's been having trouble sleeping. He was recently started on trazodone which has given him some relief    Generalized weakness -Per the physical therapist he has been progressing well. He continues to have some difficulty with ambulation. He is using a walker.   Recent UTI -The patient currently has no urinary complaints, no dysuria. He states he does follow with a urologist on a regular basis for his history of BPH. He is currently on Proscar and Flomax daily, previously the patient did require intermittent catheterization however per his account this was over 6 months ago, he was not discharged to the nursing home with an indwelling Foley catheter  Dementia - per the patient's girlfriend who is present Mr. Dechaine is currently at his baseline mental status, at time of discharge discharge patient was recurring Haldol intermittently for some  agitation, he is no longer requiring this medication as his mental status and agitation has improved  Parkinson traits-patient is followed by neurology, has not been formally diagnosed with Parkinson's disease however per the neurologist he does have Parkinsonian traits, primarily he has a shuffled gait and difficulty with ambulation including transitions/turns  Patient Active Problem List   Diagnosis Date Noted  . BPH (benign prostatic hypertrophy) with urinary retention 11/03/2012  . Unstable gait 10/30/2012  . Acute encephalopathy 10/27/2012  . UTI (lower urinary tract infection) 10/27/2012  . Peripheral neuropathy 06/08/2012  . At high risk for falls 05/10/2012  . A-fib 04/22/2012  . DOE (dyspnea on exertion) 04/01/2012  . Other malaise and fatigue 03/23/2012  . Balance problem 11/19/2010  . Nonallopathic lesion of cervicothoracic region 11/02/2010  . Nonallopathic lesion of thoracic region 11/02/2010  . URINARY URGENCY 02/28/2009  . HYPERLIPIDEMIA 11/10/2007  . ANEMIA 11/10/2007  . BENIGN PROSTATIC HYPERTROPHY, MILD, HX OF 02/10/2007  . OSTEOPOROSIS 10/14/2006  . Hypertension 10/14/2006   Past Medical History: Past Medical History  Diagnosis Date  . Osteoporosis   . BPH (benign prostatic hyperplasia)   . Hyperlipidemia   . BPH (benign prostatic hyperplasia)   . Anemia   . Macular degeneration   . Atrial fibrillation   . Parkinsonism     Past Surgical History: Past Surgical History  Procedure Laterality Date  . Tonsilectomy/adenoidectomy with myringotomy      Social History: History   Social History  . Marital Status: Widowed    Spouse Name: N/A    Number of Children: N/A  . Years of Education: N/A   Occupational History  .  retired     Art gallery manager   Social History Main Topics  . Smoking status: Never Smoker   . Smokeless tobacco: Not on file  . Alcohol Use: No     Comment: none  . Drug Use: No  . Sexual Activity: Not on file   Other Topics Concern  .  Not on file   Social History Narrative  . No narrative on file    Family History: Family History  Problem Relation Age of Onset  . Heart disease Mother   . Heart disease Father     Allergies: Allergies  Allergen Reactions  . Penicillins     unknown  . Sulfonamide Derivatives     unknown    Current Outpatient Prescriptions  Medication Sig Dispense Refill  . amLODipine (NORVASC) 10 MG tablet Take 1 tablet (10 mg total) by mouth daily.  30 tablet  0  . aspirin 81 MG tablet Take 81 mg by mouth daily.      . finasteride (PROSCAR) 5 MG tablet Take 5 mg by mouth daily.      . fish oil-omega-3 fatty acids 1000 MG capsule Take 1 g by mouth 2 (two) times daily.       . Multiple Vitamin (MULTIVITAMIN WITH MINERALS) TABS tablet Take 1 tablet by mouth daily.      . tamsulosin (FLOMAX) 0.4 MG CAPS capsule Take 0.8 mg by mouth daily after supper.      . traZODone (DESYREL) 50 MG tablet Take 1 tablet (50 mg total) by mouth at bedtime as needed for sleep.  30 tablet  0   No current facility-administered medications for this visit.   Review Of Systems: Per HPI with the following additions: Insomnia as stated above, no chest pain, no palpitations, no shortness of breath, no abdominal pain, no nausea, no vomiting, no diarrhea, no constipation, residual weakness with right lower extremity being weaker than the left Otherwise 12 point review of systems was performed and was unremarkable.  Physical Exam: Vitals (11/23/12) Pulse: 50  Blood Pressure: 148/81 RR: 18   O2: 93 on RA Temp: 97.1  General: cooperative and appears stated age HEENT: PERRLA, sclera clear, anicteric, oropharynx clear, no lesions, neck supple with midline trachea, thyroid without masses and trachea midline Heart: irregular irregular, no murmurs Lungs: clear to auscultation, no wheezes or rales and unlabored breathing Abdomen: abdomen is soft without significant tenderness, masses, organomegaly or guarding Extremities:  extremities normal, atraumatic, no cyanosis or edema Skin:no rashes, no ecchymoses Neurology: Alert and oriented x3, speech is normal, no resting tremor, cranial nerves II through XII were intact, muscle testing was 5 out of 5 in all extremities, sensation to light touch was grossly intact in all extremities, Romberg testing was unremarkable, negative pronator drift, able to perform bilateral finger to nose, intention tremor was present, gait was observed with the use of a walker, he does have a slightly shuffling gait, strength is decreased on the right compared to left, completes en block turns with the to use of a walker, no cogwheel rigidity  Labs and Imaging: Lab Results  Component Value Date/Time   NA 140 10/31/2012 11:40 AM   NA 143 10/26/2012 11:10 AM   K 3.5 10/31/2012 11:40 AM   CL 104 10/31/2012 11:40 AM   CO2 26 10/31/2012 11:40 AM   BUN 16 10/31/2012 11:40 AM   BUN 16 10/26/2012 11:10 AM   CREATININE 1.11 10/31/2012 11:40 AM   CREATININE 1.05 02/13/2011 10:17 AM   GLUCOSE 109*  10/31/2012 11:40 AM   GLUCOSE 104* 10/26/2012 11:10 AM   Lab Results  Component Value Date   WBC 8.7 10/31/2012   HGB 15.3 10/31/2012   HCT 43.8 10/31/2012   MCV 90.9 10/31/2012   PLT 233 10/31/2012    Assessment and Plan: FORTINO HAAG is a 79 y.o. year old male admitted to [facility] after discharge from [facility] on 11/02/2012 for complicated UTI and encephalopathy.

## 2012-11-26 NOTE — Assessment & Plan Note (Signed)
Patient currently denies symptoms of BPH. -Will continue home Proscar and Flomax -Given recent complicated UTI will have patient followup with his urologist

## 2012-11-26 NOTE — Assessment & Plan Note (Signed)
Currently stable. -Continue current dose of aspirin. -Patient is not a candidate for full dose anticoagulation given fall risk.

## 2012-11-26 NOTE — Assessment & Plan Note (Signed)
Unstable gait appears to be gradually improving with physical therapy. His unstable gait is likely secondary to Parkinsonian traits versus generalized deconditioning -Continue PT -Followup with outpatient neurologist

## 2012-12-02 ENCOUNTER — Non-Acute Institutional Stay: Payer: PRIVATE HEALTH INSURANCE | Admitting: Family Medicine

## 2012-12-02 DIAGNOSIS — R2689 Other abnormalities of gait and mobility: Secondary | ICD-10-CM

## 2012-12-02 DIAGNOSIS — G4700 Insomnia, unspecified: Secondary | ICD-10-CM

## 2012-12-02 DIAGNOSIS — R339 Retention of urine, unspecified: Secondary | ICD-10-CM

## 2012-12-02 DIAGNOSIS — G9340 Encephalopathy, unspecified: Secondary | ICD-10-CM

## 2012-12-02 DIAGNOSIS — I10 Essential (primary) hypertension: Secondary | ICD-10-CM

## 2012-12-02 DIAGNOSIS — R29818 Other symptoms and signs involving the nervous system: Secondary | ICD-10-CM

## 2012-12-02 DIAGNOSIS — N401 Enlarged prostate with lower urinary tract symptoms: Secondary | ICD-10-CM

## 2012-12-02 DIAGNOSIS — I4891 Unspecified atrial fibrillation: Secondary | ICD-10-CM

## 2012-12-02 NOTE — Assessment & Plan Note (Signed)
Stable on daily aspirin. Patient has not a candidate for Coumadin.

## 2012-12-02 NOTE — Progress Notes (Signed)
  Subjective:    Patient ID: Adam Garner, male    DOB: [birth year], 79 y.o.   MRN: [id number]  HPI 79 year old male admitted to [facility] nursing home on 11/02/2012 status post stay at [facility] for encephalopathy secondary to urosepsis. Patient was admitted to [facility] nursing home for physical therapy and rehabilitation services. The patient has showed significant improvement in his functional capacity during his time at [facility] nursing home and has been deemed stable for discharge. He will be scheduled to have home PT and OT.   BPH with urosepsis-patient is currently asymptomatic, denies no difficulty with urination, he is currently on Flomax and finasteride. He is scheduled to followup with his urologist  Generalized debility-improving with physical therapy services  Parkinsonian traits-patient scheduled to followup with neurology as outpatient  Encephalopathy-per the patient's girlfriend who is present his thought processes much clearer compared to when he was in hospital, she states that he is at his baseline mental status  Insomnia-patient has been taking trazodone at nighttime which has provided minimal relief of his insomnia, he does not requesting additional medication for insomnia, he feels that his insomnia will improve once his return home  Medications-reviewed her medication list which is up-to-date as documented in Epic   Review of Systems  Constitutional: Negative for fever, chills and fatigue.  Respiratory: Negative for cough and choking.   Cardiovascular: Negative for chest pain.  Gastrointestinal: Negative for nausea, vomiting and diarrhea.  Genitourinary: Negative for dysuria, urgency, hematuria, decreased urine volume, discharge and penile pain.       Objective:   Physical Exam vitals: Reviewed General: Pleasant Caucasian male, no acute distress, accompanied by his girlfriend HEENT: Pupils are equal in size, no scleral icterus, moist mucous members,  neck was supple, no thyromegaly Cardiac: Irregular irregular rhythm, S1 and S2 present, no murmurs Respiratory: Clear to auscultation bilaterally, normal effort Abdomen: Soft, nontender, bowel sounds present Extremities: Trace edema in bilateral lower extremities, 2+ radial pulses, and 2+ dorsalis pedis pulse bilaterally  Neuro: Alert and oriented x3, creatinine is 2 through 12 are intact, he was improved compared to previous examination, patient is able to stand from the bed without assistance, he was able to ambulate for a short distance without the use of a walker, his stride length appeared to be more equal bilaterally however there still was a small component of shuffling, no cogwheel rigidity Skin: No rash        Assessment & Plan:  Please see problem specific assessment and plan.

## 2012-12-02 NOTE — Assessment & Plan Note (Signed)
Improving with PT and OT. Suspect component of deconditioning. -Patient followup with neurology for Parkinsonian traits.

## 2012-12-02 NOTE — Assessment & Plan Note (Signed)
Stable on Flomax and finasteride. -Patient to followup with urology as outpatient

## 2012-12-02 NOTE — Assessment & Plan Note (Signed)
Uncontrolled. Minimal relief from trazodone. Suspect that the majority of his insomnia is secondary to being in the nursing home. -Followup with PCP at outpatient visit after he has returned home.

## 2012-12-02 NOTE — Assessment & Plan Note (Signed)
Well controlled on Norvasc 10 mg daily.

## 2012-12-02 NOTE — Assessment & Plan Note (Signed)
Resolving. Consider MMSE to be completed by PCP.

## 2012-12-08 ENCOUNTER — Telehealth: Payer: Self-pay

## 2012-12-08 NOTE — Telephone Encounter (Signed)
OK 

## 2012-12-08 NOTE — Telephone Encounter (Signed)
Adam Garner called back to say that the family does not want any PT for him and she does not need any orders at this time. jw

## 2012-12-08 NOTE — Telephone Encounter (Signed)
Gentry Roch from [facility] calls needing verbal orders for PT for patient with a start of this week or Monday.

## 2012-12-14 ENCOUNTER — Ambulatory Visit (INDEPENDENT_AMBULATORY_CARE_PROVIDER_SITE_OTHER): Payer: PRIVATE HEALTH INSURANCE | Admitting: Family Medicine

## 2012-12-14 ENCOUNTER — Encounter: Payer: Self-pay | Admitting: Family Medicine

## 2012-12-14 VITALS — BP 188/88 | HR 52 | Temp 97.7°F | Wt 180.0 lb

## 2012-12-14 DIAGNOSIS — R2681 Unsteadiness on feet: Secondary | ICD-10-CM

## 2012-12-14 DIAGNOSIS — I4891 Unspecified atrial fibrillation: Secondary | ICD-10-CM

## 2012-12-14 DIAGNOSIS — R269 Unspecified abnormalities of gait and mobility: Secondary | ICD-10-CM

## 2012-12-14 DIAGNOSIS — G4700 Insomnia, unspecified: Secondary | ICD-10-CM

## 2012-12-14 DIAGNOSIS — G9340 Encephalopathy, unspecified: Secondary | ICD-10-CM

## 2012-12-14 DIAGNOSIS — R339 Retention of urine, unspecified: Secondary | ICD-10-CM

## 2012-12-14 DIAGNOSIS — N401 Enlarged prostate with lower urinary tract symptoms: Secondary | ICD-10-CM

## 2012-12-14 DIAGNOSIS — I10 Essential (primary) hypertension: Secondary | ICD-10-CM

## 2012-12-14 NOTE — Assessment & Plan Note (Signed)
Not an anticoag candidate due to bleed risk Pt aware of stroke risk No in afib today in clinic

## 2012-12-14 NOTE — Assessment & Plan Note (Signed)
Resolved now that at home

## 2012-12-14 NOTE — Progress Notes (Signed)
Adam Garner is a 79 y.o. male who presents to [facility] today for   Left Heartland last Wed.   Neuro: No use of walker at home. Performing all ADLs. Has not been back to neurologist   Urinary retention: no further retention or need of self cath. Diapering. Still using finesteride and flomax.   Sleep: much improved now that at home.    The following portions of the patient's history were reviewed and updated as appropriate: allergies, current medications, past medical history, family and social history, and problem list.  Patient is a nonsmoker.   Past Medical History  Diagnosis Date  . Osteoporosis   . BPH (benign prostatic hyperplasia)   . Hyperlipidemia   . BPH (benign prostatic hyperplasia)   . Anemia   . Macular degeneration   . Atrial fibrillation   . Parkinsonism     ROS as above otherwise neg.    Medications reviewed. Current Outpatient Prescriptions  Medication Sig Dispense Refill  . amLODipine (NORVASC) 10 MG tablet Take 1 tablet (10 mg total) by mouth daily.  30 tablet  0  . aspirin 81 MG tablet Take 81 mg by mouth daily.      . finasteride (PROSCAR) 5 MG tablet Take 5 mg by mouth daily.      . fish oil-omega-3 fatty acids 1000 MG capsule Take 1 g by mouth 2 (two) times daily.       . Multiple Vitamin (MULTIVITAMIN WITH MINERALS) TABS tablet Take 1 tablet by mouth daily.      . tamsulosin (FLOMAX) 0.4 MG CAPS capsule Take 0.8 mg by mouth daily after supper.      . traZODone (DESYREL) 50 MG tablet Take 1 tablet (50 mg total) by mouth at bedtime as needed for sleep.  30 tablet  0   No current facility-administered medications for this visit.    Exam:  BP 148/88  Pulse 52  Temp(Src) 97.7 F (36.5 C) (Oral)  Wt 180 lb (81.647 kg) Gen: Well NAD HEENT: EOMI,  MMM Lungs: CTABL Nl WOB Heart: RRR no MRG, occasional PVC, no Afib Abd: NABS, NT, ND Exts: Non edematous BL  LE, warm and well perfused.   No results found for this or any previous visit (from the past 72  hour(s)).

## 2012-12-14 NOTE — Assessment & Plan Note (Addendum)
Resolved MMSE today 28/30  One point off for second recall adn one for [employer] drawing.

## 2012-12-14 NOTE — Patient Instructions (Signed)
Thank you for coming today You have made a remarkable recovery.  There are no changes to your medications today Please continue exercising adn eating well You will not need to follow up with the urologist unless you develop urinary symptoms again that are not managed by your medications Please come back to see me in 3 months or sooner if needed

## 2012-12-14 NOTE — Assessment & Plan Note (Signed)
Well controlled No change toda Consider decrease amlodipine at next visit if low

## 2012-12-14 NOTE — Assessment & Plan Note (Signed)
Much improved after PT at [facility] to perform ADL. No falls

## 2012-12-14 NOTE — Assessment & Plan Note (Signed)
Con flomax and finesteride

## 2012-12-30 ENCOUNTER — Other Ambulatory Visit: Payer: Self-pay | Admitting: Family Medicine

## 2012-12-30 NOTE — Telephone Encounter (Signed)
Will fwd to PCP.  Skylor Schnapp L, CMA  

## 2013-03-09 ENCOUNTER — Other Ambulatory Visit: Payer: Self-pay | Admitting: Family Medicine

## 2013-03-09 ENCOUNTER — Other Ambulatory Visit: Payer: Self-pay | Admitting: *Deleted

## 2013-03-11 MED ORDER — FINASTERIDE 5 MG PO TABS: 5.0000 mg | ORAL_TABLET | Freq: Every day | ORAL | Status: DC

## 2013-05-23 ENCOUNTER — Other Ambulatory Visit: Payer: Self-pay | Admitting: *Deleted

## 2013-05-24 MED ORDER — TAMSULOSIN HCL 0.4 MG PO CAPS: 0.8000 mg | ORAL_CAPSULE | Freq: Every day | ORAL | Status: DC

## 2013-08-23 ENCOUNTER — Telehealth: Payer: Self-pay | Admitting: Family Medicine

## 2013-08-23 NOTE — Telephone Encounter (Signed)
Adam Garner with [employer] needs verbal order for cathter supplies Pt is completely out Please call Adam Garner

## 2013-08-23 NOTE — Telephone Encounter (Signed)
[facility] Discussed patient Adam Garner. Advised that he was last seen by previous PCP Dr. Konrad DoloresMerrell on 12/14/12, at that time documentation stated that he was no longer having urinary retention and no longer needing to use self in and out foley cath. June reported that Urology (Dr. Vernie Ammonsttelin) had been contacted regarding foley catheter refill and pending response. Request send to PCP now, requesting refill on foley catheters.  Advised that patient will need to be seen for evaluation to determine if need to continue self foley cath, previously dx with BPH urinary retention. Recommended patient to schedule follow-up with both Urology and [facility]. Verbal orders given and authorized for 1 month supply urinary foley straight tip cath 3x daily in and out, and associated supplies. Patient to be notified with refill and will await follow-up.  Adam PilarAlexander Smitty Ackerley, DO [facility], PGY-2

## 2013-09-21 ENCOUNTER — Ambulatory Visit (INDEPENDENT_AMBULATORY_CARE_PROVIDER_SITE_OTHER): Payer: Medicare HMO | Admitting: Family Medicine

## 2013-09-21 ENCOUNTER — Encounter: Payer: Self-pay | Admitting: Family Medicine

## 2013-09-21 VITALS — BP 167/73 | HR 41 | Temp 97.8°F | Wt 173.0 lb

## 2013-09-21 DIAGNOSIS — N401 Enlarged prostate with lower urinary tract symptoms: Secondary | ICD-10-CM

## 2013-09-21 DIAGNOSIS — R269 Unspecified abnormalities of gait and mobility: Secondary | ICD-10-CM

## 2013-09-21 DIAGNOSIS — N138 Other obstructive and reflux uropathy: Secondary | ICD-10-CM

## 2013-09-21 DIAGNOSIS — R338 Other retention of urine: Secondary | ICD-10-CM

## 2013-09-21 DIAGNOSIS — I10 Essential (primary) hypertension: Secondary | ICD-10-CM

## 2013-09-21 DIAGNOSIS — R339 Retention of urine, unspecified: Secondary | ICD-10-CM

## 2013-09-21 DIAGNOSIS — R2681 Unsteadiness on feet: Secondary | ICD-10-CM

## 2013-09-21 LAB — BASIC METABOLIC PANEL
BUN: 16 mg/dL (ref 6–23)
CALCIUM: 9.3 mg/dL (ref 8.4–10.5)
CO2: 24 mEq/L (ref 19–32)
CREATININE: 0.92 mg/dL (ref 0.50–1.35)
Chloride: 107 mEq/L (ref 96–112)
GLUCOSE: 88 mg/dL (ref 70–99)
Potassium: 4.2 mEq/L (ref 3.5–5.3)
SODIUM: 140 meq/L (ref 135–145)

## 2013-09-21 NOTE — Assessment & Plan Note (Signed)
Currently stable, without worsening or significant urinary obstruction - Continues intermittent self-catheterization, usually preventative, no significant episode requiring cath  Plan: 1. Continue Finesteride and Flomax 2. Refill Urinary Foley Catheter supplies via [facility] (phone in and authorize appropriate catheters, anticipate 100 monthly for up to TID use) 3. Recommend follow-up with [facility] (Dr. Vernie Ammons) in future if significant change in symptoms or worsening

## 2013-09-21 NOTE — Assessment & Plan Note (Signed)
Stable, mildly improved BP  No complications   Plan:  1. Continue Amlodipine  daily - offered to decrease to trial of  daily due to chronic lower ext edema, may not need full dose for optimal BP control < 150 / 90. Patient declined and wanted to stay with same dose for now, re-check BP at next visit. 2. Check BMET 3. Monitor BP at home or at drug store occasionally

## 2013-09-21 NOTE — Assessment & Plan Note (Signed)
Currently stable, persistent intermittent unstable gait - No recent falls, or associated symptoms to suggest dizziness/vertigo, orthostasis - Ambulating without decline in daily function  Plan: 1. Continue to monitor 2. Consider repeat course HH PT in future if needed 3. Provided info for scheduling Geriatric Clinic visit in future if needed

## 2013-09-21 NOTE — Patient Instructions (Signed)
Dear Adam Garner, Thank you for coming in to clinic today. It was good to meet you!  Today we discussed your Urinary Catheters, Blood Pressure, and Balance. 1. For your catheters, I will call [employer] and order the supply of foley catheters. This may take a few days to weeks to get these ordered. 2. Also, if you have any significant urinary change with increased catheter use or other difficulties. Please schedule a follow-up with Dr. Vernie Ammons at [facility] for re-evaluation. 3. For your Blood pressure, it looks good today. We can continue Amlodipine  daily. No changes. If you develop more swelling or low blood pressures we can decrease this. 4. For your balance, make sure that you always have something to hang on to, I do recommend a cane or walker for support as needed. Stand up slow and wait a moment before starting to walk  We will check your Basic Blood work today, and send you a letter with results in 1-2 weeks.  Some important numbers from today's visit: BP - 167/73  Please schedule a follow-up appointment with me (Dr. Althea Charon) in 6 months for follow-up Blood Pressure.  You may also schedule a "Geriatric Clinic Visit" at any time with Dr. Perley Jain - they will discuss your issues of balance in much more detail if needed.  If you have any other questions or concerns, please feel free to call the clinic to contact me. You may also schedule an earlier appointment if necessary.  However, if your symptoms get significantly worse, please go to the Emergency Department to seek immediate medical attention.  Saralyn Pilar, DO [facility]

## 2013-09-21 NOTE — Progress Notes (Signed)
   Subjective:    Patient ID: Adam Garner, male    DOB: Apr 28, 1921, 80 y.o.   MRN: [id number]  HPI  BPH with urinary retention: - Reports hx several years ago, went to Urology and had urodynamic testing due to difficulty urinating, given option for surgery, but patient elected to use self-intermittent catheterization PRN when unable to void. - Currently significantly improved symptoms. Now able to void when gets "urge" and not needing regular catheterization, however still admits to self-catheterizing frequently when planning to be out of house for long periods of time to prevent frequent trips to bathroom. - Reports has been several months since he has had a significant problem where he could not void, and absolutely required catheterization - Here for follow-up to re-authorization prescription for catheter medical supplies, requesting about 3 to 4 daily supply through [facility] [facility] [employer] - Denies fevers/chills, dysuria, abdominal or flank pain, hematuria  Balance Difficulties / Hx Parkinson-like features: - Reports several years of difficulty with balance, feels "drifting backwards" occasional when ambulating. Has walker but does not use it regularly. Admits to ambulating "cautiously" and is aware of "handles and rails", usually does not ambulate alone. - Denies any dizziness, vertigo, recent falls or injuries, lightheadedness, recent vision changes  CHRONIC HTN: Reports no concerns, does not check BP regularly Current Meds - Amlodipine  daily Reports good compliance, took meds today. Tolerating well, w/o complaints. Lifestyle - decreased activity, no regular exercise Admits to chronic bilateral lower ext edema Denies CP, dyspnea, HA  I have reviewed and updated the following as appropriate: allergies and current medications  Social Hx: - Never smoker - Lives at home with wife  Review of Systems  See above HPI    Objective:   Physical Exam  BP 167/73  Pulse 41   Temp(Src) 97.8 F (36.6 C) (Oral)  Wt 173 lb (78.472 kg)  Gen - well-appearing, thin, elderly male, NAD HEENT - NCAT, PERRL, EOMI, MMM Neck - supple, non-tender Heart - RRR, no murmurs heard Lungs - CTAB. Normal work of breathing Abd - soft, NTND, +active BS Ext - bilateral lower ext non-pitting edema localized to ankles and lower leg, non-tender, peripheral pulses intact +2 b/l Skin - warm, dry, no rashes Neuro - awake, alert, oriented (name, year, location), grossly non-focal, intact distal upper and lower ext muscle strength 5/5 b/l, intact distal sensation to light touch, finger to nose normal, gait slow wide stance and cautious     Assessment & Plan:   See specific A&P problem list for details.

## 2013-09-27 ENCOUNTER — Encounter: Payer: Self-pay | Admitting: Family Medicine

## 2013-09-28 ENCOUNTER — Encounter: Payer: Self-pay | Admitting: Family Medicine

## 2013-12-20 ENCOUNTER — Telehealth: Payer: Self-pay | Admitting: Family Medicine

## 2013-12-20 NOTE — Telephone Encounter (Signed)
[facility] medical supplies Put in dr Malachi Paradisekaramelegos box for completion

## 2013-12-23 ENCOUNTER — Ambulatory Visit: Payer: Medicare HMO | Admitting: Family Medicine

## 2013-12-27 ENCOUNTER — Other Ambulatory Visit: Payer: Self-pay | Admitting: Family Medicine

## 2013-12-27 MED ORDER — AMLODIPINE BESYLATE 10 MG PO TABS: 10.0000 mg | ORAL_TABLET | Freq: Every day | ORAL | Status: DC

## 2013-12-27 NOTE — Telephone Encounter (Signed)
Pt called and needs a refill on his BP medication. He is out. [name]

## 2014-03-10 ENCOUNTER — Other Ambulatory Visit: Payer: Self-pay | Admitting: Family Medicine

## 2014-03-10 DIAGNOSIS — N400 Enlarged prostate without lower urinary tract symptoms: Secondary | ICD-10-CM

## 2014-03-10 MED ORDER — FINASTERIDE 5 MG PO TABS: 5.0000 mg | ORAL_TABLET | Freq: Every day | ORAL | Status: DC

## 2014-03-10 NOTE — Telephone Encounter (Signed)
Sent refill for Finasteride. Received refill request today 03/10/14.  Please call patient back to notify him that this has been sent in.  Thank you  Saralyn PilarAlexander Geovanie Winnett, DO [facility], PGY-2

## 2014-03-10 NOTE — Telephone Encounter (Signed)
Checking status of rx request for finasteride sent from [facility] 2 days ago

## 2014-05-15 ENCOUNTER — Emergency Department (HOSPITAL_COMMUNITY): Payer: PPO

## 2014-05-15 ENCOUNTER — Inpatient Hospital Stay (HOSPITAL_COMMUNITY)
Admission: EM | Admit: 2014-05-15 | Discharge: 2014-05-18 | DRG: 728 | Disposition: A | Discharge status: Home Health | Payer: PPO | Attending: Family Medicine | Admitting: Family Medicine

## 2014-05-15 ENCOUNTER — Encounter (HOSPITAL_COMMUNITY): Payer: Self-pay

## 2014-05-15 DIAGNOSIS — G20 Parkinson's disease: Secondary | ICD-10-CM | POA: Diagnosis present

## 2014-05-15 DIAGNOSIS — N453 Epididymo-orchitis: Principal | ICD-10-CM | POA: Diagnosis present

## 2014-05-15 DIAGNOSIS — H3530 Unspecified macular degeneration: Secondary | ICD-10-CM | POA: Diagnosis present

## 2014-05-15 DIAGNOSIS — E785 Hyperlipidemia, unspecified: Secondary | ICD-10-CM | POA: Diagnosis present

## 2014-05-15 DIAGNOSIS — N508 Other specified disorders of male genital organs: Secondary | ICD-10-CM | POA: Diagnosis present

## 2014-05-15 DIAGNOSIS — N401 Enlarged prostate with lower urinary tract symptoms: Secondary | ICD-10-CM | POA: Diagnosis present

## 2014-05-15 DIAGNOSIS — N390 Urinary tract infection, site not specified: Secondary | ICD-10-CM | POA: Diagnosis present

## 2014-05-15 DIAGNOSIS — Z87440 Personal history of urinary (tract) infections: Secondary | ICD-10-CM | POA: Diagnosis not present

## 2014-05-15 DIAGNOSIS — R739 Hyperglycemia, unspecified: Secondary | ICD-10-CM | POA: Diagnosis present

## 2014-05-15 DIAGNOSIS — M810 Age-related osteoporosis without current pathological fracture: Secondary | ICD-10-CM | POA: Diagnosis present

## 2014-05-15 DIAGNOSIS — B359 Dermatophytosis, unspecified: Secondary | ICD-10-CM | POA: Diagnosis present

## 2014-05-15 DIAGNOSIS — Z8249 Family history of ischemic heart disease and other diseases of the circulatory system: Secondary | ICD-10-CM

## 2014-05-15 DIAGNOSIS — Z79899 Other long term (current) drug therapy: Secondary | ICD-10-CM | POA: Diagnosis not present

## 2014-05-15 DIAGNOSIS — I4891 Unspecified atrial fibrillation: Secondary | ICD-10-CM | POA: Diagnosis present

## 2014-05-15 DIAGNOSIS — Z7982 Long term (current) use of aspirin: Secondary | ICD-10-CM

## 2014-05-15 DIAGNOSIS — B964 Proteus (mirabilis) (morganii) as the cause of diseases classified elsewhere: Secondary | ICD-10-CM | POA: Diagnosis present

## 2014-05-15 DIAGNOSIS — N433 Hydrocele, unspecified: Secondary | ICD-10-CM | POA: Diagnosis present

## 2014-05-15 DIAGNOSIS — R338 Other retention of urine: Secondary | ICD-10-CM | POA: Diagnosis present

## 2014-05-15 DIAGNOSIS — N50819 Testicular pain, unspecified: Secondary | ICD-10-CM

## 2014-05-15 DIAGNOSIS — N400 Enlarged prostate without lower urinary tract symptoms: Secondary | ICD-10-CM | POA: Diagnosis not present

## 2014-05-15 DIAGNOSIS — I10 Essential (primary) hypertension: Secondary | ICD-10-CM | POA: Diagnosis present

## 2014-05-15 DIAGNOSIS — L03314 Cellulitis of groin: Secondary | ICD-10-CM

## 2014-05-15 DIAGNOSIS — L0390 Cellulitis, unspecified: Secondary | ICD-10-CM | POA: Diagnosis not present

## 2014-05-15 DIAGNOSIS — R001 Bradycardia, unspecified: Secondary | ICD-10-CM | POA: Diagnosis present

## 2014-05-15 LAB — CBC WITH DIFFERENTIAL/PLATELET
Basophils Absolute: 0 10*3/uL (ref 0.0–0.1)
Basophils Relative: 0 % (ref 0–1)
EOS ABS: 0 10*3/uL (ref 0.0–0.7)
EOS PCT: 0 % (ref 0–5)
HEMATOCRIT: 43.8 % (ref 39.0–52.0)
Hemoglobin: 14.9 g/dL (ref 13.0–17.0)
LYMPHS ABS: 1 10*3/uL (ref 0.7–4.0)
Lymphocytes Relative: 8 % — ABNORMAL LOW (ref 12–46)
MCH: 30.5 pg (ref 26.0–34.0)
MCHC: 34 g/dL (ref 30.0–36.0)
MCV: 89.8 fL (ref 78.0–100.0)
MONOS PCT: 7 % (ref 3–12)
Monocytes Absolute: 1 10*3/uL (ref 0.1–1.0)
Neutro Abs: 11.6 10*3/uL — ABNORMAL HIGH (ref 1.7–7.7)
Neutrophils Relative %: 85 % — ABNORMAL HIGH (ref 43–77)
PLATELETS: 214 10*3/uL (ref 150–400)
RBC: 4.88 MIL/uL (ref 4.22–5.81)
RDW: 13.5 % (ref 11.5–15.5)
WBC: 13.6 10*3/uL — AB (ref 4.0–10.5)

## 2014-05-15 LAB — URINALYSIS, ROUTINE W REFLEX MICROSCOPIC
BILIRUBIN URINE: NEGATIVE
Glucose, UA: NEGATIVE mg/dL
Ketones, ur: NEGATIVE mg/dL
Nitrite: NEGATIVE
Protein, ur: 100 mg/dL — AB
SPECIFIC GRAVITY, URINE: 1.018 (ref 1.005–1.030)
Urobilinogen, UA: 0.2 mg/dL (ref 0.0–1.0)
pH: 8 (ref 5.0–8.0)

## 2014-05-15 LAB — COMPREHENSIVE METABOLIC PANEL
ALT: 36 U/L (ref 17–63)
ANION GAP: 9 (ref 5–15)
AST: 75 U/L — ABNORMAL HIGH (ref 15–41)
Albumin: 3.7 g/dL (ref 3.5–5.0)
Alkaline Phosphatase: 74 U/L (ref 38–126)
BUN: 28 mg/dL — AB (ref 6–20)
CO2: 23 mmol/L (ref 22–32)
Calcium: 9.2 mg/dL (ref 8.9–10.3)
Chloride: 104 mmol/L (ref 101–111)
Creatinine, Ser: 1.15 mg/dL (ref 0.61–1.24)
GFR, EST NON AFRICAN AMERICAN: 54 mL/min — AB (ref >60)
GLUCOSE: 129 mg/dL — AB (ref 70–99)
POTASSIUM: 3.7 mmol/L (ref 3.5–5.1)
SODIUM: 136 mmol/L (ref 135–145)
TOTAL PROTEIN: 7 g/dL (ref 6.5–8.1)
Total Bilirubin: 1.2 mg/dL (ref 0.3–1.2)

## 2014-05-15 LAB — URINE MICROSCOPIC-ADD ON

## 2014-05-15 LAB — I-STAT CG4 LACTIC ACID, ED
Lactic Acid, Venous: 0.86 mmol/L (ref 0.5–2.0)
Lactic Acid, Venous: 0.89 mmol/L (ref 0.5–2.0)

## 2014-05-15 MED ORDER — NYSTATIN 100000 UNIT/GM EX POWD
Freq: Three times a day (TID) | CUTANEOUS | Status: DC
  Administered 2014-05-16 – 2014-05-18 (×7): via TOPICAL
  Filled 2014-05-15: qty 15

## 2014-05-15 MED ORDER — VANCOMYCIN HCL IN DEXTROSE 1-5 GM/200ML-% IV SOLN
1000.0000 mg | INTRAVENOUS | Status: DC
  Administered 2014-05-15 – 2014-05-17 (×3): 1000 mg via INTRAVENOUS
  Filled 2014-05-15 (×3): qty 200

## 2014-05-15 MED ORDER — SODIUM CHLORIDE 0.9 % IV BOLUS (SEPSIS)
1000.0000 mL | Freq: Once | INTRAVENOUS | Status: AC
  Administered 2014-05-15: 1000 mL via INTRAVENOUS

## 2014-05-15 MED ORDER — CLINDAMYCIN PHOSPHATE 600 MG/50ML IV SOLN
600.0000 mg | Freq: Once | INTRAVENOUS | Status: AC
  Administered 2014-05-15: 600 mg via INTRAVENOUS
  Filled 2014-05-15: qty 50

## 2014-05-15 MED ORDER — CIPROFLOXACIN IN D5W 400 MG/200ML IV SOLN
400.0000 mg | Freq: Once | INTRAVENOUS | Status: AC
  Administered 2014-05-15: 400 mg via INTRAVENOUS
  Filled 2014-05-15: qty 200

## 2014-05-15 NOTE — H&P (Signed)
Adam Garner is an 81 y.o. male.     Chief Complaint: cellulitis HPI: 81 yo male with bph, Pafib (CHADS2=2), apparently c/o redness and pain of the testicles for  the past 2 days.  Slight fever.  Pt denies dysuria, hematuria, flank pain, n/v, diarrhea, brbpr, black stool.  Pt typically self caths due to bph.  Pt was found to be febrile in the ED. U/s showed complex hydrocele, and possible orchitis.  Urology has been consulted by ED.  Pt will be admitted for cellulitis/orchitis.   Past Medical History  Diagnosis Date  . Osteoporosis   . BPH (benign prostatic hyperplasia)   . Hyperlipidemia   . BPH (benign prostatic hyperplasia)   . Anemia   . Macular degeneration   . Atrial fibrillation   . Parkinsonism     Past Surgical History  Procedure Laterality Date  . Tonsilectomy/adenoidectomy with myringotomy      Family History  Problem Relation Age of Onset  . Heart disease Mother   . Heart disease Father    Social History:  reports that he has never smoked. He does not have any smokeless tobacco history on file. He reports that he does not drink alcohol or use illicit drugs.  Allergies:  Allergies  Allergen Reactions  . Penicillins     unknown  . Sulfonamide Derivatives     unknown   Medications reviewed  (Not in a hospital admission)  Results for orders placed or performed during the hospital encounter of 05/15/14 (from the past 48 hour(s))  CBC WITH DIFFERENTIAL     Status: Abnormal   Collection Time: 05/15/14  8:17 PM  Result Value Ref Range   WBC 13.6 (H) 4.0 - 10.5 K/uL   RBC 4.88 4.22 - 5.81 MIL/uL   Hemoglobin 14.9 13.0 - 17.0 g/dL   HCT 43.8 39.0 - 52.0 %   MCV 89.8 78.0 - 100.0 fL   MCH 30.5 26.0 - 34.0 pg   MCHC 34.0 30.0 - 36.0 g/dL   RDW 13.5 11.5 - 15.5 %   Platelets 214 150 - 400 K/uL   Neutrophils Relative % 85 (H) 43 - 77 %   Neutro Abs 11.6 (H) 1.7 - 7.7 K/uL   Lymphocytes Relative 8 (L) 12 - 46 %   Lymphs Abs 1.0 0.7 - 4.0 K/uL   Monocytes  Relative 7 3 - 12 %   Monocytes Absolute 1.0 0.1 - 1.0 K/uL   Eosinophils Relative 0 0 - 5 %   Eosinophils Absolute 0.0 0.0 - 0.7 K/uL   Basophils Relative 0 0 - 1 %   Basophils Absolute 0.0 0.0 - 0.1 K/uL  Comprehensive metabolic panel     Status: Abnormal   Collection Time: 05/15/14  8:17 PM  Result Value Ref Range   Sodium 136 135 - 145 mmol/L   Potassium 3.7 3.5 - 5.1 mmol/L   Chloride 104 101 - 111 mmol/L   CO2 23 22 - 32 mmol/L   Glucose, Bld 129 (H) 70 - 99 mg/dL   BUN 28 (H) 6 - 20 mg/dL   Creatinine, Ser 1.15 0.61 - 1.24 mg/dL   Calcium 9.2 8.9 - 10.3 mg/dL   Total Protein 7.0 6.5 - 8.1 g/dL   Albumin 3.7 3.5 - 5.0 g/dL   AST 75 (H) 15 - 41 U/L   ALT 36 17 - 63 U/L   Alkaline Phosphatase 74 38 - 126 U/L   Total Bilirubin 1.2 0.3 - 1.2 mg/dL  GFR calc non Af Amer 54 (L) >60 mL/min   GFR calc Af Amer >60 >60 mL/min    Comment: (NOTE) The eGFR has been calculated using the CKD EPI equation. This calculation has not been validated in all clinical situations. eGFR's persistently <90 mL/min signify possible Chronic Kidney Disease.    Anion gap 9 5 - 15  I-Stat CG4 Lactic Acid, ED  (not at [facility])     Status: None   Collection Time: 05/15/14  8:27 PM  Result Value Ref Range   Lactic Acid, Venous 0.89 0.5 - 2.0 mmol/L  Urinalysis, Routine w reflex microscopic     Status: Abnormal   Collection Time: 05/15/14  8:35 PM  Result Value Ref Range   Color, Urine YELLOW YELLOW   APPearance TURBID (A) CLEAR   Specific Gravity, Urine 1.018 1.005 - 1.030   pH 8.0 5.0 - 8.0   Glucose, UA NEGATIVE NEGATIVE mg/dL   Hgb urine dipstick MODERATE (A) NEGATIVE   Bilirubin Urine NEGATIVE NEGATIVE   Ketones, ur NEGATIVE NEGATIVE mg/dL   Protein, ur 100 (A) NEGATIVE mg/dL   Urobilinogen, UA 0.2 0.0 - 1.0 mg/dL   Nitrite NEGATIVE NEGATIVE   Leukocytes, UA LARGE (A) NEGATIVE  Urine microscopic-add on     Status: Abnormal   Collection Time: 05/15/14  8:35 PM  Result Value Ref Range   WBC,  UA 21-50 <3 WBC/hpf   RBC / HPF 0-2 <3 RBC/hpf   Bacteria, UA FEW (A) RARE   Crystals TRIPLE PHOSPHATE CRYSTALS (A) NEGATIVE   Urine-Other AMORPHOUS URATES/PHOSPHATES   I-Stat CG4 Lactic Acid, ED  (not at [facility])     Status: None   Collection Time: 05/15/14 11:34 PM  Result Value Ref Range   Lactic Acid, Venous 0.86 0.5 - 2.0 mmol/L   US Scrotum  05/15/2014   CLINICAL DATA:  Right testicular pain and swelling for 2 days. Chills and leukocytosis.  EXAM: SCROTAL ULTRASOUND  DOPPLER ULTRASOUND OF THE TESTICLES  TECHNIQUE: Complete ultrasound examination of the testicles, epididymis, and other scrotal structures was performed. Color and spectral Doppler ultrasound were also utilized to evaluate blood flow to the testicles.  COMPARISON:  None.  FINDINGS: Right testicle  Measurements: 5.0 x 3.6 x 3.3 cm. The right testis is enlarged compared to the left. It is edematous with mildly inhomogeneous echotexture but no discrete mass. It is markedly hyperemic on Doppler.  Left testicle  Measurements: 4.5 x 2.6 x 2.7 cm. There are a few tiny benign-appearing cysts measuring 2-4 mm. No significant mass. Normal vascularity on Doppler.  Right epididymis: Enlarged and edematous with marked hyperemia on Doppler.  Left epididymis:  Normal in size and appearance.  Hydrocele: On the right there is a complex hydrocele with numerous internal septations. This could represent hematoma or scrotal abscess. On the left there is a small simple hydrocele.  Varicocele:  None visualized.  Pulsed Doppler interrogation of both testes demonstrates no evidence of portion. There is hyperemia of the right testis and right epididymis.  There is marked scrotal skin thickening on the right, up to 1.5 cm, with hyperemia.  IMPRESSION: The right testis and epididymis are enlarged, edematous and hyperemic. There is thickening and hyperemia of the scrotal skin, greatest on the right. There is a complicated right hydrocele. These findings may represent  epididymo-orchitis, with scrotal cellulitis and possible scrotal abscess on the right.   Electronically Signed   By: Andreas Newport M.D.   On: 05/15/2014 22:29   [place] Art/ven  Flow Abd Pelv Doppler  05/15/2014   CLINICAL DATA:  Right testicular pain and swelling for 2 days. Chills and leukocytosis.  EXAM: SCROTAL ULTRASOUND  DOPPLER ULTRASOUND OF THE TESTICLES  TECHNIQUE: Complete ultrasound examination of the testicles, epididymis, and other scrotal structures was performed. Color and spectral Doppler ultrasound were also utilized to evaluate blood flow to the testicles.  COMPARISON:  None.  FINDINGS: Right testicle  Measurements: 5.0 x 3.6 x 3.3 cm. The right testis is enlarged compared to the left. It is edematous with mildly inhomogeneous echotexture but no discrete mass. It is markedly hyperemic on Doppler.  Left testicle  Measurements: 4.5 x 2.6 x 2.7 cm. There are a few tiny benign-appearing cysts measuring 2-4 mm. No significant mass. Normal vascularity on Doppler.  Right epididymis: Enlarged and edematous with marked hyperemia on Doppler.  Left epididymis:  Normal in size and appearance.  Hydrocele: On the right there is a complex hydrocele with numerous internal septations. This could represent hematoma or scrotal abscess. On the left there is a small simple hydrocele.  Varicocele:  None visualized.  Pulsed Doppler interrogation of both testes demonstrates no evidence of portion. There is hyperemia of the right testis and right epididymis.  There is marked scrotal skin thickening on the right, up to 1.5 cm, with hyperemia.  IMPRESSION: The right testis and epididymis are enlarged, edematous and hyperemic. There is thickening and hyperemia of the scrotal skin, greatest on the right. There is a complicated right hydrocele. These findings may represent epididymo-orchitis, with scrotal cellulitis and possible scrotal abscess on the right.   Electronically Signed   By: Andreas Newport M.D.   On: 05/15/2014  22:29   Dg Chest Port 1 View  05/15/2014   CLINICAL DATA:  Right testicle pain.  EXAM: PORTABLE CHEST - 1 VIEW  COMPARISON:  10/26/2012  FINDINGS: Hypoventilation with interstitial crowding at the bases. Stable mild cardiomegaly and aortic tortuosity. There is no edema, consolidation, effusion, or pneumothorax.  IMPRESSION: Chronic cardiomegaly and hypoventilation. No change from 2014 to suggest acute disease.   Electronically Signed   By: Monte Fantasia M.D.   On: 05/15/2014 20:42    Review of Systems  Constitutional: Positive for fever. Negative for chills, weight loss, malaise/fatigue and diaphoresis.  HENT: Negative.   Eyes: Negative.   Respiratory: Negative.   Cardiovascular: Negative.   Gastrointestinal: Negative.   Genitourinary: Negative.   Musculoskeletal: Negative.   Skin: Positive for rash. Negative for itching.  Neurological: Negative.  Negative for weakness.  Endo/Heme/Allergies: Negative.   Psychiatric/Behavioral: Negative.     Blood pressure 142/75, pulse 64, temperature 100.4 F (38 C), temperature source Oral, resp. rate 25, height 5' 7"  (1.702 m), weight 79.379 kg (175 lb), SpO2 97 %. Physical Exam  Constitutional: He is oriented to person, place, and time. He appears well-developed and well-nourished.  HENT:  Head: Normocephalic and atraumatic.  Mouth/Throat: No oropharyngeal exudate.  Eyes: Conjunctivae and EOM are normal. Pupils are equal, round, and reactive to light. No scleral icterus.  Neck: Normal range of motion. Neck supple. No JVD present. No tracheal deviation present. No thyromegaly present.  Cardiovascular: Normal rate and regular rhythm.  Exam reveals no gallop and no friction rub.   No murmur heard. Respiratory: Effort normal and breath sounds normal. No respiratory distress. He has no wheezes. He has no rales.  GI: Soft. Bowel sounds are normal. He exhibits no distension. There is no tenderness. There is no rebound and no guarding.   Musculoskeletal:  Normal range of motion. He exhibits no edema or tenderness.  Lymphadenopathy:    He has no cervical adenopathy.  Neurological: He is alert and oriented to person, place, and time. He has normal reflexes. He displays normal reflexes. No cranial nerve deficit. He exhibits normal muscle tone. Coordination normal.  Skin: Skin is warm. Rash noted. There is erythema. No pallor.  + erythema of testicles  Psychiatric: He has a normal mood and affect. His behavior is normal. Judgment and thought content normal.     Assessment/Plan Cellulitis/Orchitis r/o [place] Gangrene Appreciate urology input tx with aztreonam, clindaymycin, and vanco iv Blood culture x2 pending  ? Tinea Nystatin powder  Bph Pt may need to self cath Cont flomax, cont finasteride  Pafib Aspirin, no coumadin due to fall risk  Hyperglycemia Check hga1c  DVT prophylaxis:  Scd, lovenox   Jani Gravel 05/15/2014, 11:46 PM

## 2014-05-15 NOTE — ED Provider Notes (Signed)
CSN: [id number]     Arrival date & time 05/15/14  1743 History   First MD Initiated Contact with Patient 05/15/14 1945     Chief Complaint  Patient presents with  . Testicle Pain    Patient is a 81 y.o. male presenting with testicular pain. The history is provided by the patient and the spouse. No language interpreter was used.  Testicle Pain   Mr. Morrish presents for evaluation of lower abdominal pain. He has a history of frequent urinary tract infections. He self caths at home every 3-4 hours. Yesterday he forgot to self cath for about 8 hours. After that he awoke with swelling in his right testicle and pain in his lower abdomen. Since that event he's been cathing every 3-4 hours and the swelling has decreased to some extent but he has continued pain and swelling in the right testicle. Symptoms are moderate, constant, worsening.  Past Medical History  Diagnosis Date  . Osteoporosis   . BPH (benign prostatic hyperplasia)   . Hyperlipidemia   . BPH (benign prostatic hyperplasia)   . Anemia   . Macular degeneration   . Atrial fibrillation   . Parkinsonism    Past Surgical History  Procedure Laterality Date  . Tonsilectomy/adenoidectomy with myringotomy     Family History  Problem Relation Age of Onset  . Heart disease Mother   . Heart disease Father    History  Substance Use Topics  . Smoking status: Never Smoker   . Smokeless tobacco: Not on file  . Alcohol Use: No     Comment: none    Review of Systems  Genitourinary: Positive for testicular pain.  All other systems reviewed and are negative.     Allergies  Penicillins and Sulfonamide derivatives  Home Medications   Prior to Admission medications   Medication Sig Start Date End Date Taking? Authorizing Provider  amLODipine (NORVASC) 10 MG tablet Take 1 tablet (10 mg total) by mouth daily. 12/27/13  Yes Smitty Cords, DO  aspirin 81 MG tablet Take 81 mg by mouth daily.   Yes Historical Provider, MD   finasteride (PROSCAR) 5 MG tablet Take 1 tablet (5 mg total) by mouth daily. 03/10/14  Yes Alexander Fredric Mare, DO  fish oil-omega-3 fatty acids 1000 MG capsule Take 1 g by mouth 2 (two) times daily.    Yes Historical Provider, MD  Multiple Vitamin (MULTIVITAMIN WITH MINERALS) TABS tablet Take 1 tablet by mouth daily.   Yes Historical Provider, MD  tamsulosin (FLOMAX) 0.4 MG CAPS capsule Take 2 capsules (0.8 mg total) by mouth daily after supper.   Yes Ozella Rocks, MD  traZODone (DESYREL) 50 MG tablet Take 1 tablet (50 mg total) by mouth at bedtime as needed for sleep. Patient not taking: Reported on 05/15/2014 11/02/12   Abram Sander, MD   BP 147/63 mmHg  Pulse 66  Temp(Src) 100.4 F (38 C) (Oral)  Resp 19  Ht  (1.702 m)  Wt 175 lb (79.379 kg)  BMI 27.40 kg/m2  SpO2 93% Physical Exam  Constitutional: He is oriented to person, place, and time. He appears well-developed and well-nourished.  HENT:  Head: Normocephalic and atraumatic.  Cardiovascular: Normal rate and regular rhythm.   No murmur heard. Pulmonary/Chest: Effort normal and breath sounds normal. No respiratory distress.  Abdominal: Soft. There is no rebound and no guarding.  Mild lower abdominal tenderness without guarding or rebound  Genitourinary:  Marked swelling, erythema, tenderness over the right scrotum  and testicle. There is diffuse erythema of the scrotum, penis, mons. No crepitance  Musculoskeletal: He exhibits no edema or tenderness.  Neurological: He is alert and oriented to person, place, and time.  Skin: Skin is warm and dry.  Psychiatric: He has a normal mood and affect. His behavior is normal.  Nursing note and vitals reviewed.   ED Course  Procedures (including critical care time) Labs Review Labs Reviewed  CBC WITH DIFFERENTIAL/PLATELET - Abnormal; Notable for the following:    WBC 13.6 (*)    Neutrophils Relative % 85 (*)    Neutro Abs 11.6 (*)    Lymphocytes Relative 8 (*)    All  other components within normal limits  COMPREHENSIVE METABOLIC PANEL - Abnormal; Notable for the following:    Glucose, Bld 129 (*)    BUN 28 (*)    AST 75 (*)    GFR calc non Af Amer 54 (*)    All other components within normal limits  URINALYSIS, ROUTINE W REFLEX MICROSCOPIC - Abnormal; Notable for the following:    APPearance TURBID (*)    Hgb urine dipstick MODERATE (*)    Protein, ur 100 (*)    Leukocytes, UA LARGE (*)    All other components within normal limits  URINE MICROSCOPIC-ADD ON - Abnormal; Notable for the following:    Bacteria, UA FEW (*)    Crystals TRIPLE PHOSPHATE CRYSTALS (*)    All other components within normal limits  CULTURE, BLOOD (ROUTINE X 2)  CULTURE, BLOOD (ROUTINE X 2)  URINE CULTURE  CBC WITH DIFFERENTIAL/PLATELET  COMPREHENSIVE METABOLIC PANEL  SEDIMENTATION RATE  I-STAT CG4 LACTIC ACID, ED  I-STAT CG4 LACTIC ACID, ED    Imaging Review Kore[place] Scrotum  05/15/2014   CLINICAL DATA:  Right testicular pain and swelling for 2 days. Chills and leukocytosis.  EXAM: SCROTAL ULTRASOUND  DOPPLER ULTRASOUND OF THE TESTICLES  TECHNIQUE: Complete ultrasound examination of the testicles, epididymis, and other scrotal structures was performed. Color and spectral Doppler ultrasound were also utilized to evaluate blood flow to the testicles.  COMPARISON:  None.  FINDINGS: Right testicle  Measurements: 5.0 x 3.6 x 3.3 cm. The right testis is enlarged compared to the left. It is edematous with mildly inhomogeneous echotexture but no discrete mass. It is markedly hyperemic on Doppler.  Left testicle  Measurements: 4.5 x 2.6 x 2.7 cm. There are a few tiny benign-appearing cysts measuring 2-4 mm. No significant mass. Normal vascularity on Doppler.  Right epididymis: Enlarged and edematous with marked hyperemia on Doppler.  Left epididymis:  Normal in size and appearance.  Hydrocele: On the right there is a complex hydrocele with numerous internal septations. This could represent  hematoma or scrotal abscess. On the left there is a small simple hydrocele.  Varicocele:  None visualized.  Pulsed Doppler interrogation of both testes demonstrates no evidence of portion. There is hyperemia of the right testis and right epididymis.  There is marked scrotal skin thickening on the right, up to 1.5 cm, with hyperemia.  IMPRESSION: The right testis and epididymis are enlarged, edematous and hyperemic. There is thickening and hyperemia of the scrotal skin, greatest on the right. There is a complicated right hydrocele. These findings may represent epididymo-orchitis, with scrotal cellulitis and possible scrotal abscess on the right.   Electronically Signed   By: Ellery Plunkaniel R Mitchell M.D.   On: 05/15/2014 22:29   [place] Art/ven Flow Abd Pelv Doppler  05/15/2014   CLINICAL DATA:  Right testicular pain and  swelling for 2 days. Chills and leukocytosis.  EXAM: SCROTAL ULTRASOUND  DOPPLER ULTRASOUND OF THE TESTICLES  TECHNIQUE: Complete ultrasound examination of the testicles, epididymis, and other scrotal structures was performed. Color and spectral Doppler ultrasound were also utilized to evaluate blood flow to the testicles.  COMPARISON:  None.  FINDINGS: Right testicle  Measurements: 5.0 x 3.6 x 3.3 cm. The right testis is enlarged compared to the left. It is edematous with mildly inhomogeneous echotexture but no discrete mass. It is markedly hyperemic on Doppler.  Left testicle  Measurements: 4.5 x 2.6 x 2.7 cm. There are a few tiny benign-appearing cysts measuring 2-4 mm. No significant mass. Normal vascularity on Doppler.  Right epididymis: Enlarged and edematous with marked hyperemia on Doppler.  Left epididymis:  Normal in size and appearance.  Hydrocele: On the right there is a complex hydrocele with numerous internal septations. This could represent hematoma or scrotal abscess. On the left there is a small simple hydrocele.  Varicocele:  None visualized.  Pulsed Doppler interrogation of both testes  demonstrates no evidence of portion. There is hyperemia of the right testis and right epididymis.  There is marked scrotal skin thickening on the right, up to 1.5 cm, with hyperemia.  IMPRESSION: The right testis and epididymis are enlarged, edematous and hyperemic. There is thickening and hyperemia of the scrotal skin, greatest on the right. There is a complicated right hydrocele. These findings may represent epididymo-orchitis, with scrotal cellulitis and possible scrotal abscess on the right.   Electronically Signed   By: Ellery Plunk M.D.   On: 05/15/2014 22:29   Dg Chest Port 1 View  05/15/2014   CLINICAL DATA:  Right testicle pain.  EXAM: PORTABLE CHEST - 1 VIEW  COMPARISON:  10/26/2012  FINDINGS: Hypoventilation with interstitial crowding at the bases. Stable mild cardiomegaly and aortic tortuosity. There is no edema, consolidation, effusion, or pneumothorax.  IMPRESSION: Chronic cardiomegaly and hypoventilation. No change from 2014 to suggest acute disease.   Electronically Signed   By: Marnee Spring M.D.   On: 05/15/2014 20:42     EKG Interpretation None      MDM   Final diagnoses:  Acute UTI  Cellulitis of groin    Patient here for testicular pain and swelling. Examination initially concerning for abscess versus mass. Ultrasound demonstrates hydrocele with cellulitis. Patient was initiated on broad-spectrum antibiotics for skin infection. UA is consistent with urinary tract infection. Discussed with Dr. Mena Goes with Urology, who will see the patient in consult. Discussed with hospitalist regarding admission for IV antibiotics.    Tilden Fossa, MD 05/16/14 [id number]

## 2014-05-15 NOTE — ED Notes (Signed)
Informed the 4th floor charge nurse and the ED nurse the pt has a ready bed @ 2327 and the pt should be coming up in 20 min.

## 2014-05-15 NOTE — Progress Notes (Signed)
ANTIBIOTIC CONSULT NOTE - INITIAL  Pharmacy Consult for vancomycin Indication: GU abscess  Allergies  Allergen Reactions  . Penicillins     unknown  . Sulfonamide Derivatives     unknown    Patient Measurements: Height: 5\' 7"  (170.2 cm) Weight: 175 lb (79.379 kg) IBW/kg (Calculated) : 66.1  Vital Signs: Temp: 100.4 F (38 C) (05/02 1752) Temp Source: Oral (05/02 1752) BP: 147/63 mmHg (05/02 1900) Pulse Rate: 66 (05/02 1900) Intake/Output from previous day:   Intake/Output from this shift:    Labs:  Recent Labs  05/15/14 2017  WBC 13.6*  HGB 14.9  PLT 214  CREATININE 1.15   Estimated Creatinine Clearance: 43.1 mL/min (by C-G formula based on Cr of 1.15). No results for input(s): VANCOTROUGH, VANCOPEAK, VANCORANDOM, GENTTROUGH, GENTPEAK, GENTRANDOM, TOBRATROUGH, TOBRAPEAK, TOBRARND, AMIKACINPEAK, AMIKACINTROU, AMIKACIN in the last 72 hours.   Microbiology: No results found for this or any previous visit (from the past 720 hour(s)).  Medical History: Past Medical History  Diagnosis Date  . Osteoporosis   . BPH (benign prostatic hyperplasia)   . Hyperlipidemia   . BPH (benign prostatic hyperplasia)   . Anemia   . Macular degeneration   . Atrial fibrillation   . Parkinsonism     Assessment: 81 y.o. male presents for evaluation of lower abdominal pain. He has a history of frequent urinary tract infections. He self caths at home every 3-4 hours. Yesterday he forgot to self cath for about 8 hours. After that he awoke with swelling in his right testicle and pain in his lower abdomen. Pharmacy consulted to dose vancomycin for GU abscess.  Goal of Therapy:  Vancomycin trough level 15-20 mcg/ml vanc per renal function  Plan:  Vancomycin 1gm IV q24h Follow renal function, cultures, clinical course vanc trough at steady state   Arley Phenixllen Aimar Shrewsbury RPh 05/15/2014, 9:55 PM Pager [phone]

## 2014-05-15 NOTE — Consult Note (Signed)
Consult: Right epididymoorchitis Requested by: Dr. Madilyn Hookees  History of Present Illness: Patient developed right testicular pain 2 days ago with testicular swelling. Gradual onset. He feels like it's slightly better tonight. Otherwise he has no complaints. He has been performing CIC every 2-4 hours without difficulty for past history of BPH with urinary retention.  Patient is elderly with a white count and fever developing and appreciate hospitalist admission for medical management. He underwent scrotal ultrasound which showed a hyperemic and swollen right testicle and epididymis. There is a complex hydrocele surrounding. The left testicle appeared normal. I reviewed all the images.   Urinalysis showed a few bacteria. He did have a urinary tract infection in 2014 which showed a pansensitive Escherichia coli.    Past Medical History  Diagnosis Date  . Osteoporosis   . BPH (benign prostatic hyperplasia)   . Hyperlipidemia   . BPH (benign prostatic hyperplasia)   . Anemia   . Macular degeneration   . Atrial fibrillation   . Parkinsonism    Past Surgical History  Procedure Laterality Date  . Tonsilectomy/adenoidectomy with myringotomy      Home Medications:   (Not in a hospital admission) Allergies:  Allergies  Allergen Reactions  . Penicillins     unknown  . Sulfonamide Derivatives     unknown    Family History  Problem Relation Age of Onset  . Heart disease Mother   . Heart disease Father    Social History:  reports that he has never smoked. He does not have any smokeless tobacco history on file. He reports that he does not drink alcohol or use illicit drugs.  ROS: A complete review of systems was performed.  All systems are negative except for pertinent findings as noted. ROS   Physical Exam:  Vital signs in last 24 hours: Temp:  [100.4 F (38 C)] 100.4 F (38 C) (05/02 1752) Pulse Rate:  [49-87] 64 (05/02 2149) Resp:  [19-25] 25 (05/02 2149) BP: (142-157)/(56-78)  142/75 mmHg (05/02 2149) SpO2:  [93 %-97 %] 97 % (05/02 2149) Weight:  [79.379 kg (175 lb)] 79.379 kg (175 lb) (05/02 2016) General:  Alert and oriented, No acute distress HEENT: Normocephalic, atraumatic Neck: No JVD or lymphadenopathy Cardiovascular: Regular rate and rhythm Lungs: Regular rate and effort Abdomen: Soft, nontender, nondistended, no abdominal masses Back: No CVA tenderness Extremities: No edema Neurologic: Grossly intact GU: There is some mild edema of the penis and scrotum more so on the right. There is hyperemia of the scrotum and penis just going on to the suprapubic area. This appears more of a reaction to the underlying process than cellulitis. The left testicle is palpably normal. The right testicle and epididymis are indurated and enlarged. There is a small hydrocele. There is no fluctuance. The scrotal skin shows no necrosis.    Laboratory Data:  Results for orders placed or performed during the hospital encounter of 05/15/14 (from the past 24 hour(s))  CBC WITH DIFFERENTIAL     Status: Abnormal   Collection Time: 05/15/14  8:17 PM  Result Value Ref Range   WBC 13.6 (H) 4.0 - 10.5 K/uL   RBC 4.88 4.22 - 5.81 MIL/uL   Hemoglobin 14.9 13.0 - 17.0 g/dL   HCT 02.743.8 25.339.0 - 66.452.0 %   MCV 89.8 78.0 - 100.0 fL   MCH 30.5 26.0 - 34.0 pg   MCHC 34.0 30.0 - 36.0 g/dL   RDW 40.313.5 47.411.5 - 25.915.5 %   Platelets 214 150 - 400  K/uL   Neutrophils Relative % 85 (H) 43 - 77 %   Neutro Abs 11.6 (H) 1.7 - 7.7 K/uL   Lymphocytes Relative 8 (L) 12 - 46 %   Lymphs Abs 1.0 0.7 - 4.0 K/uL   Monocytes Relative 7 3 - 12 %   Monocytes Absolute 1.0 0.1 - 1.0 K/uL   Eosinophils Relative 0 0 - 5 %   Eosinophils Absolute 0.0 0.0 - 0.7 K/uL   Basophils Relative 0 0 - 1 %   Basophils Absolute 0.0 0.0 - 0.1 K/uL  Comprehensive metabolic panel     Status: Abnormal   Collection Time: 05/15/14  8:17 PM  Result Value Ref Range   Sodium 136 135 - 145 mmol/L   Potassium 3.7 3.5 - 5.1 mmol/L    Chloride 104 101 - 111 mmol/L   CO2 23 22 - 32 mmol/L   Glucose, Bld 129 (H) 70 - 99 mg/dL   BUN 28 (H) 6 - 20 mg/dL   Creatinine, Ser 1.61 0.61 - 1.24 mg/dL   Calcium 9.2 8.9 - 09.6 mg/dL   Total Protein 7.0 6.5 - 8.1 g/dL   Albumin 3.7 3.5 - 5.0 g/dL   AST 75 (H) 15 - 41 U/L   ALT 36 17 - 63 U/L   Alkaline Phosphatase 74 38 - 126 U/L   Total Bilirubin 1.2 0.3 - 1.2 mg/dL   GFR calc non Af Amer 54 (L) >60 mL/min   GFR calc Af Amer >60 >60 mL/min   Anion gap 9 5 - 15  I-Stat CG4 Lactic Acid, ED  (not at [facility])     Status: None   Collection Time: 05/15/14  8:27 PM  Result Value Ref Range   Lactic Acid, Venous 0.89 0.5 - 2.0 mmol/L  Urinalysis, Routine w reflex microscopic     Status: Abnormal   Collection Time: 05/15/14  8:35 PM  Result Value Ref Range   Color, Urine YELLOW YELLOW   APPearance TURBID (A) CLEAR   Specific Gravity, Urine 1.018 1.005 - 1.030   pH 8.0 5.0 - 8.0   Glucose, UA NEGATIVE NEGATIVE mg/dL   Hgb urine dipstick MODERATE (A) NEGATIVE   Bilirubin Urine NEGATIVE NEGATIVE   Ketones, ur NEGATIVE NEGATIVE mg/dL   Protein, ur 045 (A) NEGATIVE mg/dL   Urobilinogen, UA 0.2 0.0 - 1.0 mg/dL   Nitrite NEGATIVE NEGATIVE   Leukocytes, UA LARGE (A) NEGATIVE  Urine microscopic-add on     Status: Abnormal   Collection Time: 05/15/14  8:35 PM  Result Value Ref Range   WBC, UA 21-50 <3 WBC/hpf   RBC / HPF 0-2 <3 RBC/hpf   Bacteria, UA FEW (A) RARE   Crystals TRIPLE PHOSPHATE CRYSTALS (A) NEGATIVE   Urine-Other AMORPHOUS URATES/PHOSPHATES   I-Stat CG4 Lactic Acid, ED  (not at [facility])     Status: None   Collection Time: 05/15/14 11:34 PM  Result Value Ref Range   Lactic Acid, Venous 0.86 0.5 - 2.0 mmol/L   No results found for this or any previous visit (from the past 240 hour(s)). Creatinine:  Recent Labs  05/15/14 2017  CREATININE 1.15    Impression/Assessment/plan: Right epididymoorchitis-patient needs IV antibiotics which have been started. BPH with urinary  retention-Will have a foley catheter placed until swelling diminishes and infection improves.  I will notify Dr. Vernie Ammons of admission.  Marenda Accardi 05/15/2014, 11:55 PM

## 2014-05-15 NOTE — ED Notes (Signed)
Pt states testicular swelling and pain since yesterday.  Pt self caths.  Pt skipped a self cath yesterday.  No injury noted.

## 2014-05-15 NOTE — ED Notes (Signed)
US at bedside

## 2014-05-15 NOTE — ED Notes (Signed)
MD at bedside. 

## 2014-05-15 NOTE — ED Notes (Signed)
Urology at bedside.

## 2014-05-16 DIAGNOSIS — L0390 Cellulitis, unspecified: Secondary | ICD-10-CM

## 2014-05-16 DIAGNOSIS — R001 Bradycardia, unspecified: Secondary | ICD-10-CM | POA: Diagnosis present

## 2014-05-16 DIAGNOSIS — N453 Epididymo-orchitis: Principal | ICD-10-CM | POA: Diagnosis present

## 2014-05-16 LAB — CBC WITH DIFFERENTIAL/PLATELET
BASOS ABS: 0 10*3/uL (ref 0.0–0.1)
Basophils Relative: 0 % (ref 0–1)
EOS ABS: 0 10*3/uL (ref 0.0–0.7)
EOS PCT: 0 % (ref 0–5)
HEMATOCRIT: 43.3 % (ref 39.0–52.0)
Hemoglobin: 14.7 g/dL (ref 13.0–17.0)
LYMPHS PCT: 13 % (ref 12–46)
Lymphs Abs: 1.1 10*3/uL (ref 0.7–4.0)
MCH: 30.4 pg (ref 26.0–34.0)
MCHC: 33.9 g/dL (ref 30.0–36.0)
MCV: 89.6 fL (ref 78.0–100.0)
MONO ABS: 0.7 10*3/uL (ref 0.1–1.0)
Monocytes Relative: 8 % (ref 3–12)
Neutro Abs: 6.9 10*3/uL (ref 1.7–7.7)
Neutrophils Relative %: 79 % — ABNORMAL HIGH (ref 43–77)
PLATELETS: 187 10*3/uL (ref 150–400)
RBC: 4.83 MIL/uL (ref 4.22–5.81)
RDW: 13.5 % (ref 11.5–15.5)
WBC: 8.8 10*3/uL (ref 4.0–10.5)

## 2014-05-16 LAB — COMPREHENSIVE METABOLIC PANEL
ALT: 36 U/L (ref 17–63)
AST: 62 U/L — AB (ref 15–41)
Albumin: 3.5 g/dL (ref 3.5–5.0)
Alkaline Phosphatase: 71 U/L (ref 38–126)
Anion gap: 8 (ref 5–15)
BUN: 25 mg/dL — AB (ref 6–20)
CALCIUM: 9 mg/dL (ref 8.9–10.3)
CHLORIDE: 107 mmol/L (ref 101–111)
CO2: 24 mmol/L (ref 22–32)
CREATININE: 1.13 mg/dL (ref 0.61–1.24)
GFR calc Af Amer: >60 mL/min (ref >60)
GFR, EST NON AFRICAN AMERICAN: 55 mL/min — AB (ref >60)
GLUCOSE: 111 mg/dL — AB (ref 70–99)
Potassium: 3.1 mmol/L — ABNORMAL LOW (ref 3.5–5.1)
Sodium: 139 mmol/L (ref 135–145)
TOTAL PROTEIN: 6.7 g/dL (ref 6.5–8.1)
Total Bilirubin: 1.1 mg/dL (ref 0.3–1.2)

## 2014-05-16 LAB — SEDIMENTATION RATE: Sed Rate: 34 mm/hr — ABNORMAL HIGH (ref 0–16)

## 2014-05-16 LAB — TROPONIN I: Troponin I: 0.03 ng/mL (ref <0.031)

## 2014-05-16 MED ORDER — AMLODIPINE BESYLATE 10 MG PO TABS
10.0000 mg | ORAL_TABLET | Freq: Every day | ORAL | Status: DC
  Administered 2014-05-16 – 2014-05-18 (×3): 10 mg via ORAL
  Filled 2014-05-16 (×3): qty 1

## 2014-05-16 MED ORDER — ACETAMINOPHEN 325 MG PO TABS: 650.0000 mg | ORAL_TABLET | Freq: Four times a day (QID) | ORAL | Status: DC | PRN

## 2014-05-16 MED ORDER — SODIUM CHLORIDE 0.9 % IV SOLN
INTRAVENOUS | Status: DC
  Administered 2014-05-17: 09:00:00 via INTRAVENOUS

## 2014-05-16 MED ORDER — DEXTROSE 5 % IV SOLN
1.0000 g | Freq: Three times a day (TID) | INTRAVENOUS | Status: DC
  Administered 2014-05-16 – 2014-05-18 (×7): 1 g via INTRAVENOUS
  Filled 2014-05-16 (×8): qty 1

## 2014-05-16 MED ORDER — SODIUM CHLORIDE 0.9 % IJ SOLN
3.0000 mL | Freq: Two times a day (BID) | INTRAMUSCULAR | Status: DC
  Administered 2014-05-17 – 2014-05-18 (×2): 3 mL via INTRAVENOUS

## 2014-05-16 MED ORDER — ASPIRIN 81 MG PO CHEW
81.0000 mg | CHEWABLE_TABLET | Freq: Every day | ORAL | Status: DC
  Administered 2014-05-16 – 2014-05-18 (×3): 81 mg via ORAL
  Filled 2014-05-16 (×3): qty 1

## 2014-05-16 MED ORDER — OMEGA-3-ACID ETHYL ESTERS 1 G PO CAPS
1.0000 g | ORAL_CAPSULE | Freq: Two times a day (BID) | ORAL | Status: DC
  Administered 2014-05-16 – 2014-05-18 (×6): 1 g via ORAL
  Filled 2014-05-16 (×7): qty 1

## 2014-05-16 MED ORDER — POTASSIUM CHLORIDE CRYS ER 20 MEQ PO TBCR
40.0000 meq | EXTENDED_RELEASE_TABLET | Freq: Four times a day (QID) | ORAL | Status: AC
  Administered 2014-05-16 (×2): 40 meq via ORAL
  Filled 2014-05-16 (×2): qty 2

## 2014-05-16 MED ORDER — FINASTERIDE 5 MG PO TABS
5.0000 mg | ORAL_TABLET | Freq: Every day | ORAL | Status: DC
  Administered 2014-05-16 – 2014-05-18 (×3): 5 mg via ORAL
  Filled 2014-05-16 (×3): qty 1

## 2014-05-16 MED ORDER — TAMSULOSIN HCL 0.4 MG PO CAPS
0.8000 mg | ORAL_CAPSULE | Freq: Every day | ORAL | Status: DC
  Administered 2014-05-16 – 2014-05-17 (×2): 0.8 mg via ORAL
  Filled 2014-05-16 (×3): qty 2

## 2014-05-16 MED ORDER — ACETAMINOPHEN 650 MG RE SUPP: 650.0000 mg | Freq: Four times a day (QID) | RECTAL | Status: DC | PRN

## 2014-05-16 MED ORDER — ENOXAPARIN SODIUM 40 MG/0.4ML ~~LOC~~ SOLN
40.0000 mg | Freq: Every day | SUBCUTANEOUS | Status: DC
  Administered 2014-05-16 – 2014-05-17 (×3): 40 mg via SUBCUTANEOUS
  Filled 2014-05-16 (×4): qty 0.4

## 2014-05-16 MED ORDER — SODIUM CHLORIDE 0.9 % IV SOLN
INTRAVENOUS | Status: AC
  Administered 2014-05-16: 01:00:00 via INTRAVENOUS

## 2014-05-16 MED ORDER — LEVOFLOXACIN IN D5W 500 MG/100ML IV SOLN
500.0000 mg | INTRAVENOUS | Status: DC
  Filled 2014-05-16: qty 100

## 2014-05-16 NOTE — Evaluation (Signed)
Physical Therapy Evaluation Patient Details Name: Adam MornRobert H Deleonardis MRN: [id number] DOB: 08/24/1921 Today's Date: 05/16/2014   History of Present Illness   Pt is a 81 year old male admitted for Epididymoorchitis with hx of afib, BPH, Parkinsonism, macular degeneration  Clinical Impression  Pt admitted with above diagnosis. Pt currently with functional limitations due to the deficits listed below (see PT Problem List).  Pt will benefit from skilled PT to increase their independence and safety with mobility to allow discharge to the venue listed below.   Pt assisted with short distance ambulation min assist however requiring max assist for bed mobility.  Pt and significant other adamantly decline SNF at this time so therefore recommend HHPT.  Pt also states he has 3 steps to enter home so may need more assist or transport upon d/c.    Follow Up Recommendations SNF (however pt declines SNF so HHPT)    Equipment Recommendations  3in1 (PT)    Recommendations for Other Services       Precautions / Restrictions Precautions Precautions: Fall      Mobility  Bed Mobility Overal bed mobility: Needs Assistance Bed Mobility: Supine to Sit;Sit to Supine     Supine to sit: Max assist Sit to supine: Max assist   General bed mobility comments: verbal cues for self assist, pt with difficulty performing bed mobility requiring assist for upper and lower body  Transfers Overall transfer level: Needs assistance Equipment used: Rolling walker (2 wheeled) Transfers: Sit to/from Stand Sit to Stand: Mod assist         General transfer comment: verbal cues for safe technique, assist to rise and steady as well as control descent  Ambulation/Gait Ambulation/Gait assistance: Min assist Ambulation Distance (Feet): 40 Feet Assistive device: Rolling walker (2 wheeled) Gait Pattern/deviations: Step-through pattern;Wide base of support;Decreased stride length;Trunk flexed Gait velocity: decr   General  Gait Details: initially stiff unsteady gait however improved with distance, verbal cues for use of RW  Stairs            Wheelchair Mobility    Modified Rankin (Stroke Patients Only)       Balance Overall balance assessment: History of Falls                                           Pertinent Vitals/Pain Pain Assessment: 0-10 Pain Score: 3  Pain Location: scrotal area Pain Descriptors / Indicators: Sore Pain Intervention(s): Limited activity within patient's tolerance;Monitored during session;Repositioned (elevated testicles, pt reports pain much improved since admission)    Home Living Family/patient expects to be discharged to:: Private residence Living Arrangements: Spouse/significant other Available Help at Discharge: Family Type of Home: House Home Access: Stairs to enter   Secretary/administratorntrance Stairs-Number of Steps: 3 Home Layout: One level Home Equipment: Environmental consultantWalker - 2 wheels;Cane - single point      Prior Function Level of Independence: Independent with assistive device(s)         Comments: does not like to use RW, pt states he prefers to have hand to hold     Hand Dominance        Extremity/Trunk Assessment   Upper Extremity Assessment: Generalized weakness           Lower Extremity Assessment: Generalized weakness         Communication   Communication: No difficulties  Cognition Arousal/Alertness: Awake/alert Behavior [facility]: [facility]  for tasks assessed/performed Overall Cognitive Status: Within Functional Limits for tasks assessed                      General Comments      Exercises        Assessment/Plan    PT Assessment Patient needs continued PT services  PT Diagnosis Difficulty walking;Generalized weakness   PT Problem List Decreased strength;Decreased activity tolerance;Decreased balance;Decreased mobility;Decreased knowledge of use of DME;Pain  PT Treatment Interventions DME instruction;Gait  training;Stair training;Functional mobility training;Patient/family education;Therapeutic activities;Therapeutic exercise;Balance training   PT Goals (Current goals can be found in the Care Plan section) Acute Rehab PT Goals PT Goal Formulation: With patient/family Time For Goal Achievement: 05/30/14 Potential to Achieve Goals: Good    Frequency Min 3X/week   Barriers to discharge        Co-evaluation               End of Session Equipment Utilized During Treatment: Gait belt Activity Tolerance: Patient tolerated treatment well Patient left: in bed;with call bell/phone within reach;with bed alarm set;with family/visitor present Nurse Communication: Mobility status         Time: 0454-0981 PT Time Calculation (min) (ACUTE ONLY): 16 min   Charges:   PT Evaluation $Initial PT Evaluation Tier I: 1 Procedure     PT G Codes:        Elo Marmolejos,KATHrine E 05/16/2014, 3:01 PM Zenovia Jarred, PT, DPT 05/16/2014 Pager: [phone]

## 2014-05-16 NOTE — Care Management Note (Addendum)
Case Management Note  Patient Details  Name: Adam Garner MRN: [id number] Date of Birth: 01/06/1922  Subjective/Objective:      81 y/o m admitted w/scrotal cellulitis.              Action/Plan:from home.   Expected Discharge Date:  05/19/14               Expected Discharge Plan:  Skilled Nursing Facility  In-House Referral:     Discharge planning Services  CM Consult  Post Acute Care Choice:    Choice offered to:     DME Arranged:    DME Agency:     HH Arranged:    HH Agency:     Status of Service:  In process, will continue to follow  Medicare Important Message Given:    Date Medicare IM Given:    Medicare IM give by:    Date Additional Medicare IM Given:    Additional Medicare Important Message give by:     If discussed at Long Length of Stay Meetings, dates discussed:    Additional Comments:PT-SNF. Patient pleasantly declines SNF. [facility]. HHPT ordered.Patient has 3n1,rw.He states they choose their own transp home.   PT cons-await recommendations.  Lanier ClamMahabir, Lisia [place]bay, RN 05/16/2014, 1:22 PM

## 2014-05-16 NOTE — Progress Notes (Addendum)
Patient noted to have 2 second pause on monitor.  BP elevated at 138/109.  EKG performed.  Dr. Arthor CaptainElmahi aware. Patient comfortable in bed, no complaints or s/s of bradycardia.  Rate currently in the 60s. Will continue to monitor.

## 2014-05-16 NOTE — Progress Notes (Signed)
[facility] PROGRESS NOTE   Adam Garner EAV:[id number] DOB: 04-Aug-1921 DOA: 05/15/2014 PCP: Saralyn Pilar, DO  HPI/Subjective: Seen with friend at bedside, feels better than yesterday. Per him less swelling and redness in both testicles.  Assessment/Plan: Principal Problem:   Epididymoorchitis Active Problems:   Hypertension   A-fib   BPH (benign prostatic hypertrophy) with urinary retention   Cellulitis   Bradycardia    Right-sided epididymoorchitis Patient presented with redness, swelling and pain/tenderness in the right testicle. Ultrasound was done and showed right-sided epididymoorchitis. Started on antibiotics, vancomycin and aztreonam, improving. Appreciate urology's consultation, recommended to continue antibiotics.  UTI Patient has history of BPH/urinary retention and has to self cath himself every 6 hours. Urinalysis consistent with UTI, patient is on aztreonam.  Bradycardia Patient has atrial fibrillation with slow ventricular response. Telemetry showed bradycardia down to the 30s while he was sleeping, heart rate went back to the 50s when he is awake. No recent complaints of chest pain, no symptoms when he had the bradycardia. Check troponins 1 and EKG. Likely to monitor closely without intervention, he is not on any AV nodal blocking medications.  BPH/urinary retention Does self-cath at home, currently Foley catheter.  Code Status: Full Code Family Communication: Plan discussed with the patient. Disposition Plan: Remains inpatient Diet: Diet Heart Room service appropriate?: Yes; Fluid consistency:: Thin  Consultants:  Urology  Procedures:  None  Antibiotics:  Vancomycin and aztreonam   Objective: Filed Vitals:   05/16/14 0619  BP: 162/65  Pulse: 74  Temp: 98.3 F (36.8 C)  Resp: 20    Intake/Output Summary (Last 24 hours) at 05/16/14 1224 Last data filed at 05/16/14 0800  Gross per 24 hour  Intake 596.25 ml    Output    500 ml  Net  96.25 ml   Filed Weights   05/15/14 2016 05/16/14 0002  Weight: 79.379 kg (175 lb) 79.107 kg (174 lb 6.4 oz)    Exam: General: Alert and awake, oriented x3, not in any acute distress. HEENT: anicteric sclera, pupils reactive to light and accommodation, EOMI CVS: S1-S2 clear, no murmur rubs or gallops Chest: clear to auscultation bilaterally, no wheezing, rales or rhonchi Abdomen: soft nontender, nondistended, normal bowel sounds, no organomegaly Extremities: no cyanosis, clubbing or edema noted bilaterally Neuro: Cranial nerves II-XII intact, no focal neurological deficits  Data Reviewed: Basic Metabolic Panel:  Recent Labs Lab 05/15/14 2017 05/16/14 0448  NA 136 139  K 3.7 3.1*  CL 104 107  CO2 23 24  GLUCOSE 129* 111*  BUN 28* 25*  CREATININE 1.15 1.13  CALCIUM 9.2 9.0   Liver Function Tests:  Recent Labs Lab 05/15/14 2017 05/16/14 0448  AST 75* 62*  ALT 36 36  ALKPHOS 74 71  BILITOT 1.2 1.1  PROT 7.0 6.7  ALBUMIN 3.7 3.5   No results for input(s): LIPASE, AMYLASE in the last 168 hours. No results for input(s): AMMONIA in the last 168 hours. CBC:  Recent Labs Lab 05/15/14 2017 05/16/14 0448  WBC 13.6* 8.8  NEUTROABS 11.6* 6.9  HGB 14.9 14.7  HCT 43.8 43.3  MCV 89.8 89.6  PLT 214 187   Cardiac Enzymes: No results for input(s): CKTOTAL, CKMB, CKMBINDEX, TROPONINI in the last 168 hours. BNP (last 3 results) No results for input(s): BNP in the last 8760 hours.  ProBNP (last 3 results) No results for input(s): PROBNP in the last 8760 hours.  CBG: No results for input(s): GLUCAP in the last 168 hours.  Micro No  results found for this or any previous visit (from the past 240 hour(s)).   Studies: US Scrotum  05/15/2014   CLINICAL DATA:  Right testicular pain and swelling for 2 days. Chills and leukocytosis.  EXAM: SCROTAL ULTRASOUND  DOPPLER ULTRASOUND OF THE TESTICLES  TECHNIQUE: Complete ultrasound examination of the  testicles, epididymis, and other scrotal structures was performed. Color and spectral Doppler ultrasound were also utilized to evaluate blood flow to the testicles.  COMPARISON:  None.  FINDINGS: Right testicle  Measurements: 5.0 x 3.6 x 3.3 cm. The right testis is enlarged compared to the left. It is edematous with mildly inhomogeneous echotexture but no discrete mass. It is markedly hyperemic on Doppler.  Left testicle  Measurements: 4.5 x 2.6 x 2.7 cm. There are a few tiny benign-appearing cysts measuring 2-4 mm. No significant mass. Normal vascularity on Doppler.  Right epididymis: Enlarged and edematous with marked hyperemia on Doppler.  Left epididymis:  Normal in size and appearance.  Hydrocele: On the right there is a complex hydrocele with numerous internal septations. This could represent hematoma or scrotal abscess. On the left there is a small simple hydrocele.  Varicocele:  None visualized.  Pulsed Doppler interrogation of both testes demonstrates no evidence of portion. There is hyperemia of the right testis and right epididymis.  There is marked scrotal skin thickening on the right, up to 1.5 cm, with hyperemia.  IMPRESSION: The right testis and epididymis are enlarged, edematous and hyperemic. There is thickening and hyperemia of the scrotal skin, greatest on the right. There is a complicated right hydrocele. These findings may represent epididymo-orchitis, with scrotal cellulitis and possible scrotal abscess on the right.   Electronically Signed   By: Ellery Plunk M.D.   On: 05/15/2014 22:29   [place] Art/ven Flow Abd Pelv Doppler  05/15/2014   CLINICAL DATA:  Right testicular pain and swelling for 2 days. Chills and leukocytosis.  EXAM: SCROTAL ULTRASOUND  DOPPLER ULTRASOUND OF THE TESTICLES  TECHNIQUE: Complete ultrasound examination of the testicles, epididymis, and other scrotal structures was performed. Color and spectral Doppler ultrasound were also utilized to evaluate blood flow to the  testicles.  COMPARISON:  None.  FINDINGS: Right testicle  Measurements: 5.0 x 3.6 x 3.3 cm. The right testis is enlarged compared to the left. It is edematous with mildly inhomogeneous echotexture but no discrete mass. It is markedly hyperemic on Doppler.  Left testicle  Measurements: 4.5 x 2.6 x 2.7 cm. There are a few tiny benign-appearing cysts measuring 2-4 mm. No significant mass. Normal vascularity on Doppler.  Right epididymis: Enlarged and edematous with marked hyperemia on Doppler.  Left epididymis:  Normal in size and appearance.  Hydrocele: On the right there is a complex hydrocele with numerous internal septations. This could represent hematoma or scrotal abscess. On the left there is a small simple hydrocele.  Varicocele:  None visualized.  Pulsed Doppler interrogation of both testes demonstrates no evidence of portion. There is hyperemia of the right testis and right epididymis.  There is marked scrotal skin thickening on the right, up to 1.5 cm, with hyperemia.  IMPRESSION: The right testis and epididymis are enlarged, edematous and hyperemic. There is thickening and hyperemia of the scrotal skin, greatest on the right. There is a complicated right hydrocele. These findings may represent epididymo-orchitis, with scrotal cellulitis and possible scrotal abscess on the right.   Electronically Signed   By: Ellery Plunk M.D.   On: 05/15/2014 22:29   Dg Chest Port 1  View  05/15/2014   CLINICAL DATA:  Right testicle pain.  EXAM: PORTABLE CHEST - 1 VIEW  COMPARISON:  10/26/2012  FINDINGS: Hypoventilation with interstitial crowding at the bases. Stable mild cardiomegaly and aortic tortuosity. There is no edema, consolidation, effusion, or pneumothorax.  IMPRESSION: Chronic cardiomegaly and hypoventilation. No change from 2014 to suggest acute disease.   Electronically Signed   By: Marnee SpringJonathon  Watts M.D.   On: 05/15/2014 20:42    Scheduled Meds: . amLODipine  10 mg Oral Daily  . aspirin  81 mg Oral  Daily  . aztreonam  1 g Intravenous 3 times per day  . enoxaparin (LOVENOX) injection  40 mg Subcutaneous QHS  . finasteride  5 mg Oral Daily  . nystatin   Topical TID  . omega-3 acid ethyl esters  1 g Oral BID  . potassium chloride  40 mEq Oral Q6H  . sodium chloride  3 mL Intravenous Q12H  . tamsulosin  0.8 mg Oral QPC supper  . vancomycin  1,000 mg Intravenous Q24H   Continuous Infusions:      Time spent: 35 minutes    [facility] Newport BeachELMAHI,Alpa Salvo A  [facility] Pager [phone] If 7PM-7AM, please contact night-coverage at [email or web address], password [email or web address] 05/16/2014, 12:24 PM  LOS: 1 day

## 2014-05-16 NOTE — Progress Notes (Signed)
Brief Antibiotic note: Azactam See previous note  From 5/2 for full details  Assessement:  Cellulitis/Orchitis/r/o [place] Gangrene  Scr stable, CrCl~41 ml/min  Plan:  Azactam 1Gm IV q8h  F/u SCr/cultures as needed  Lorenza EvangelistGreen, Evolette Pendell R  05/16/2014 3:20 AM

## 2014-05-16 NOTE — Progress Notes (Signed)
Subjective: Patient reports Marked improvement in his scrotal pain.  He told me that yesterday he could not even stand to touch his scrotum and now is able to with much less discomfort.  Objective: Vital signs in last 24 hours: Temp:  [98.3 F (36.8 C)-100.4 F (38 C)] 98.3 F (36.8 C) (05/03 0619) Pulse Rate:  [49-87] 74 (05/03 0619) Resp:  [19-25] 20 (05/03 0619) BP: (142-162)/(56-78) 162/65 mmHg (05/03 0619) SpO2:  [93 %-97 %] 94 % (05/03 0619) Weight:  [79.107 kg (174 lb 6.4 oz)-79.379 kg (175 lb)] 79.107 kg (174 lb 6.4 oz) (05/03 0002)  Intake/Output from previous day: 05/02 0701 - 05/03 0700 In: 476.3 [I.V.:426.3; IV Piggyback:50] Out: 500 [Urine:500] Intake/Output this shift:    Physical Exam:  GU: his scrotal skin is mildly erythematous but has no crepitus.The left testicle is palpably normal and the right testicle is firm with an indistinct epididymis and is tender to palpation.  Lab Results:  Recent Labs  05/15/14 2017 05/16/14 0448  HGB 14.9 14.7  HCT 43.8 43.3   BMET  Recent Labs  05/15/14 2017 05/16/14 0448  NA 136 139  K 3.7 3.1*  CL 104 107  CO2 23 24  GLUCOSE 129* 111*  BUN 28* 25*  CREATININE 1.15 1.13  CALCIUM 9.2 9.0   No results for input(s): LABPT, INR in the last 72 hours. No results for input(s): LABURIN in the last 72 hours. Results for orders placed or performed during the hospital encounter of 10/26/12  Culture, Urine     Status: None   Collection Time: 10/26/12 11:54 PM  Result Value Ref Range Status   Specimen Description   Final    URINE, CLEAN CATCH Performed at [facility]   Special Requests NONE Performed at [facility]  Final   Culture  Setup Time   Final    10/27/2012 10:48 Performed at [facility]   Colony Count   Final    8,000 COLONIES/ML Performed at [facility]   Culture   Final    INSIGNIFICANT GROWTH Performed at [facility]   Report Status  10/28/2012 FINAL  Final    Studies/Results: US Scrotum  05/15/2014   CLINICAL DATA:  Right testicular pain and swelling for 2 days. Chills and leukocytosis.  EXAM: SCROTAL ULTRASOUND  DOPPLER ULTRASOUND OF THE TESTICLES  TECHNIQUE: Complete ultrasound examination of the testicles, epididymis, and other scrotal structures was performed. Color and spectral Doppler ultrasound were also utilized to evaluate blood flow to the testicles.  COMPARISON:  None.  FINDINGS: Right testicle  Measurements: 5.0 x 3.6 x 3.3 cm. The right testis is enlarged compared to the left. It is edematous with mildly inhomogeneous echotexture but no discrete mass. It is markedly hyperemic on Doppler.  Left testicle  Measurements: 4.5 x 2.6 x 2.7 cm. There are a few tiny benign-appearing cysts measuring 2-4 mm. No significant mass. Normal vascularity on Doppler.  Right epididymis: Enlarged and edematous with marked hyperemia on Doppler.  Left epididymis:  Normal in size and appearance.  Hydrocele: On the right there is a complex hydrocele with numerous internal septations. This could represent hematoma or scrotal abscess. On the left there is a small simple hydrocele.  Varicocele:  None visualized.  Pulsed Doppler interrogation of both testes demonstrates no evidence of portion. There is hyperemia of the right testis and right epididymis.  There is marked scrotal skin thickening on the right, up to 1.5 cm, with hyperemia.  IMPRESSION: The right testis and epididymis are enlarged, edematous and hyperemic. There is thickening and hyperemia of the scrotal skin, greatest on the right. There is a complicated right hydrocele. These findings may represent epididymo-orchitis, with scrotal cellulitis and possible scrotal abscess on the right.   Electronically Signed   By: Ellery Plunkaniel R Mitchell M.D.   On: 05/15/2014 22:29   [place] Art/ven Flow Abd Pelv Doppler  05/15/2014   CLINICAL DATA:  Right testicular pain and swelling for 2 days. Chills and  leukocytosis.  EXAM: SCROTAL ULTRASOUND  DOPPLER ULTRASOUND OF THE TESTICLES  TECHNIQUE: Complete ultrasound examination of the testicles, epididymis, and other scrotal structures was performed. Color and spectral Doppler ultrasound were also utilized to evaluate blood flow to the testicles.  COMPARISON:  None.  FINDINGS: Right testicle  Measurements: 5.0 x 3.6 x 3.3 cm. The right testis is enlarged compared to the left. It is edematous with mildly inhomogeneous echotexture but no discrete mass. It is markedly hyperemic on Doppler.  Left testicle  Measurements: 4.5 x 2.6 x 2.7 cm. There are a few tiny benign-appearing cysts measuring 2-4 mm. No significant mass. Normal vascularity on Doppler.  Right epididymis: Enlarged and edematous with marked hyperemia on Doppler.  Left epididymis:  Normal in size and appearance.  Hydrocele: On the right there is a complex hydrocele with numerous internal septations. This could represent hematoma or scrotal abscess. On the left there is a small simple hydrocele.  Varicocele:  None visualized.  Pulsed Doppler interrogation of both testes demonstrates no evidence of portion. There is hyperemia of the right testis and right epididymis.  There is marked scrotal skin thickening on the right, up to 1.5 cm, with hyperemia.  IMPRESSION: The right testis and epididymis are enlarged, edematous and hyperemic. There is thickening and hyperemia of the scrotal skin, greatest on the right. There is a complicated right hydrocele. These findings may represent epididymo-orchitis, with scrotal cellulitis and possible scrotal abscess on the right.   Electronically Signed   By: Ellery Plunkaniel R Mitchell M.D.   On: 05/15/2014 22:29   Dg Chest Port 1 View  05/15/2014   CLINICAL DATA:  Right testicle pain.  EXAM: PORTABLE CHEST - 1 VIEW  COMPARISON:  10/26/2012  FINDINGS: Hypoventilation with interstitial crowding at the bases. Stable mild cardiomegaly and aortic tortuosity. There is no edema, consolidation,  effusion, or pneumothorax.  IMPRESSION: Chronic cardiomegaly and hypoventilation. No change from 2014 to suggest acute disease.   Electronically Signed   By: Marnee SpringJonathon  Watts M.D.   On: 05/15/2014 20:42    Assessment/Plan: Right epididymoorchitis with complex hydrocele: Clinically he is making significant improvement on antibiotic therapy.  His white blood cell count has improved and clinically he is much less tender.  I think he has a left epididymoorchitis with a complex, reactive hydrocele and not a scrotal abscess since he is improving quite dramatically with antibiotic therapy alone.  There is currently no indication for surgical drainage of his right hemiscrotum.  Continue current antibiotic therapy  Await culture results to direct further therapy  Continue Foley catheter for now but would remove the catheter prior to discharge and allow him to begin self-catheterization.  He will follow-up with me as an outpatient.   LOS: 1 day   Tresia Revolorio C 05/16/2014, 7:17 AM

## 2014-05-16 NOTE — Progress Notes (Signed)
Patient noted to be brady while asleep with HR down to 37 and sustaining in the 40s.  Upon awakening pt's HR comes back up to the 50s/60s.  MD aware, will continue to monitor.

## 2014-05-17 ENCOUNTER — Telehealth: Payer: Self-pay | Admitting: Family Medicine

## 2014-05-17 DIAGNOSIS — I4891 Unspecified atrial fibrillation: Secondary | ICD-10-CM

## 2014-05-17 LAB — BASIC METABOLIC PANEL
Anion gap: 4 — ABNORMAL LOW (ref 5–15)
BUN: 15 mg/dL (ref 6–20)
CO2: 23 mmol/L (ref 22–32)
CREATININE: 1 mg/dL (ref 0.61–1.24)
Calcium: 8.3 mg/dL — ABNORMAL LOW (ref 8.9–10.3)
Chloride: 108 mmol/L (ref 101–111)
GFR calc non Af Amer: >60 mL/min (ref >60)
Glucose, Bld: 116 mg/dL — ABNORMAL HIGH (ref 70–99)
POTASSIUM: 3.9 mmol/L (ref 3.5–5.1)
Sodium: 135 mmol/L (ref 135–145)

## 2014-05-17 NOTE — Progress Notes (Signed)
Patient in and out catheterizes himself at home due to urinary retention and patient asks for catheter to be left in until he is discharged home.  Dr. Irene LimboGoodrich notified and ordered for patient to continue foley catheter.  Will continue to monitor patient.

## 2014-05-17 NOTE — Progress Notes (Signed)
Subjective: Patient reports no new complaints. Less testicular/scrotal pain.  Objective: Vital signs in last 24 hours: Temp:  [97.9 F (36.6 C)-98.3 F (36.8 C)] 98 F (36.7 C) (05/04 0437) Pulse Rate:  [57-75] 57 (05/04 0437) Resp:  [18-20] 18 (05/04 0437) BP: (138-161)/(69-109) 161/69 mmHg (05/04 0437) SpO2:  [95 %-97 %] 96 % (05/04 0437) Weight:  [78.699 kg (173 lb 8 oz)] 78.699 kg (173 lb 8 oz) (05/04 0437)  Intake/Output from previous day: 05/03 0701 - 05/04 0700 In: 1328.8 [P.O.:360; I.V.:468.8; IV Piggyback:500] Out: 1800 [Urine:1800] Intake/Output this shift: Total I/O In: 450 [IV Piggyback:450] Out: 1100 [Urine:1100]  Physical Exam:  Scrotum without crepitus and only mild erythema. Rt. Testicle slightly tender, swollen without fluctuance.  Lab Results:  Recent Labs  05/15/14 2017 05/16/14 0448  HGB 14.9 14.7  HCT 43.8 43.3   BMET  Recent Labs  05/16/14 0448 05/17/14 0513  NA 139 135  K 3.1* 3.9  CL 107 108  CO2 24 23  GLUCOSE 111* 116*  BUN 25* 15  CREATININE 1.13 1.00  CALCIUM 9.0 8.3*   No results for input(s): LABPT, INR in the last 72 hours. No results for input(s): LABURIN in the last 72 hours. Results for orders placed or performed during the hospital encounter of 05/15/14  Urine culture     Status: None (Preliminary result)   Collection Time: 05/15/14  8:35 PM  Result Value Ref Range Status   Specimen Description URINE, CLEAN CATCH  Final   Special Requests NONE  Final   Colony Count   Final    >=100,000 COLONIES/ML [facility]    Culture   Final    GRAM NEGATIVE RODS [facility]formed at [facility]    Report Status PENDING  Incomplete    Studies/Results: [place] Scrotum  05/15/2014   CLINICAL DATA:  Right testicular pain and swelling for 2 days. Chills and leukocytosis.  EXAM: SCROTAL ULTRASOUND  DOPPLER ULTRASOUND OF THE TESTICLES  TECHNIQUE: Complete ultrasound examination of the testicles, epididymis,  and other scrotal structures was performed. Color and spectral Doppler ultrasound were also utilized to evaluate blood flow to the testicles.  COMPARISON:  None.  FINDINGS: Right testicle  Measurements: 5.0 x 3.6 x 3.3 cm. The right testis is enlarged compared to the left. It is edematous with mildly inhomogeneous echotexture but no discrete mass. It is markedly hyperemic on Doppler.  Left testicle  Measurements: 4.5 x 2.6 x 2.7 cm. There are a few tiny benign-appearing cysts measuring 2-4 mm. No significant mass. Normal vascularity on Doppler.  Right epididymis: Enlarged and edematous with marked hyperemia on Doppler.  Left epididymis:  Normal in size and appearance.  Hydrocele: On the right there is a complex hydrocele with numerous internal septations. This could represent hematoma or scrotal abscess. On the left there is a small simple hydrocele.  Varicocele:  None visualized.  Pulsed Doppler interrogation of both testes demonstrates no evidence of portion. There is hyperemia of the right testis and right epididymis.  There is marked scrotal skin thickening on the right, up to 1.5 cm, with hyperemia.  IMPRESSION: The right testis and epididymis are enlarged, edematous and hyperemic. There is thickening and hyperemia of the scrotal skin, greatest on the right. There is a complicated right hydrocele. These findings may represent epididymo-orchitis, with scrotal cellulitis and possible scrotal abscess on the right.   Electronically Signed   By: Ellery Plunkaniel R Mitchell M.D.   On: 05/15/2014 22:29   [place] Art/ven  Flow Abd Pelv Doppler  05/15/2014   CLINICAL DATA:  Right testicular pain and swelling for 2 days. Chills and leukocytosis.  EXAM: SCROTAL ULTRASOUND  DOPPLER ULTRASOUND OF THE TESTICLES  TECHNIQUE: Complete ultrasound examination of the testicles, epididymis, and other scrotal structures was performed. Color and spectral Doppler ultrasound were also utilized to evaluate blood flow to the testicles.  COMPARISON:   None.  FINDINGS: Right testicle  Measurements: 5.0 x 3.6 x 3.3 cm. The right testis is enlarged compared to the left. It is edematous with mildly inhomogeneous echotexture but no discrete mass. It is markedly hyperemic on Doppler.  Left testicle  Measurements: 4.5 x 2.6 x 2.7 cm. There are a few tiny benign-appearing cysts measuring 2-4 mm. No significant mass. Normal vascularity on Doppler.  Right epididymis: Enlarged and edematous with marked hyperemia on Doppler.  Left epididymis:  Normal in size and appearance.  Hydrocele: On the right there is a complex hydrocele with numerous internal septations. This could represent hematoma or scrotal abscess. On the left there is a small simple hydrocele.  Varicocele:  None visualized.  Pulsed Doppler interrogation of both testes demonstrates no evidence of portion. There is hyperemia of the right testis and right epididymis.  There is marked scrotal skin thickening on the right, up to 1.5 cm, with hyperemia.  IMPRESSION: The right testis and epididymis are enlarged, edematous and hyperemic. There is thickening and hyperemia of the scrotal skin, greatest on the right. There is a complicated right hydrocele. These findings may represent epididymo-orchitis, with scrotal cellulitis and possible scrotal abscess on the right.   Electronically Signed   By: Ellery Plunkaniel R Mitchell M.D.   On: 05/15/2014 22:29   Dg Chest Port 1 View  05/15/2014   CLINICAL DATA:  Right testicle pain.  EXAM: PORTABLE CHEST - 1 VIEW  COMPARISON:  10/26/2012  FINDINGS: Hypoventilation with interstitial crowding at the bases. Stable mild cardiomegaly and aortic tortuosity. There is no edema, consolidation, effusion, or pneumothorax.  IMPRESSION: Chronic cardiomegaly and hypoventilation. No change from 2014 to suggest acute disease.   Electronically Signed   By: Marnee SpringJonathon  Watts M.D.   On: 05/15/2014 20:42    Assessment/Plan: Continued improvement of Rt. epididymo-orchitis. Cult. + for GNRs ID and sens  pend.  Elevate scrotum for comfort.  Abx. Per Cult.   F/U with me as outpatient.   LOS: 2 days   Adam Garner C 05/17/2014, 6:56 AM

## 2014-05-17 NOTE — Progress Notes (Signed)
Physical Therapy Treatment Patient Details Name: Adam MornRobert H Garner MRN: [id number] DOB: 03/29/1921 Today's Date: 05/17/2014    History of Present Illness  Pt is a 81 year old male admitted for Epididymoorchitis with hx of afib, BPH, Parkinsonism, macular degeneration    PT Comments    Pt OOB in recliner c/o "some" discomfort scrotal area.  Assisted with getting out of recliner and anb a great distance in hallway.  Good alternating gait only mild shuffle noted.  NO LOB.  Good safety cognition.  Follow Up Recommendations  Home health PT (Pt declining SNF rec)     Equipment Recommendations  3in1 (PT)    Recommendations for Other Services       Precautions / Restrictions Precautions Precautions: Fall Precaution Comments: Parkinson's/Macular Restrictions Weight Bearing Restrictions: No    Mobility  Bed Mobility               General bed mobility comments: Pt OOB in recliner  Transfers Overall transfer level: Needs assistance Equipment used: Rolling walker (2 wheeled) Transfers: Sit to/from Stand Sit to Stand: Min assist         General transfer comment: verbal cues for safe technique, assist to rise and steady as well as control descent  Ambulation/Gait Ambulation/Gait assistance: Min guard Ambulation Distance (Feet): 85 Feet Assistive device: Rolling walker (2 wheeled) Gait Pattern/deviations: Step-through pattern;Decreased stride length Gait velocity: decreased but functional   General Gait Details: initially stiff unsteady gait however improved with distance, verbal cues for use of RW   Stairs            Wheelchair Mobility    Modified Rankin (Stroke Patients Only)       Balance                                    Cognition Arousal/Alertness: Awake/alert Behavior During Therapy: WFL for tasks assessed/performed Overall Cognitive Status: Within Functional Limits for tasks assessed                      Exercises       General Comments        Pertinent Vitals/Pain Pain Assessment: 0-10 Pain Score: 2  Pain Location: scrotal area Pain Descriptors / Indicators: Sore;Tender Pain Intervention(s): Monitored during session;Repositioned    Home Living                      Prior Function            PT Goals (current goals can now be found in the care plan section) Progress towards PT goals: Progressing toward goals    Frequency  Min 3X/week    PT Plan      Co-evaluation             End of Session Equipment Utilized During Treatment: Gait belt Activity Tolerance: Patient tolerated treatment well Patient left: in chair;with call bell/phone within reach;with family/visitor present     Time: 1350-1414 PT Time Calculation (min) (ACUTE ONLY): 24 min  Charges:  $Gait Training: 8-22 mins $Therapeutic Activity: 8-22 mins                    G Codes:      Adam ShellingLori Shailynn Garner  PTA [facility]  Acute  Rehab Pager      [phone]

## 2014-05-17 NOTE — Progress Notes (Signed)
  PROGRESS NOTE  Adam DecantRobert H Garner [id number] DOB: 01/17/1921 DOA: 05/15/2014 PCP: Saralyn PilarKaramalegos, Alexander, DO  Assessment/Plan: 1. Right-sided epididymoorchitis. Slowly improving. Continue antibiotics, follow-up culture. 2. UTI. Antibiotics as above. 3. Age of fibrillation. Heart rate stable. 4. BPH, self-cath    Overall slowly improving. Continue antibiotics. Follow-up culture.  Hopefully home next 48 hours.  Code Status: full code DVT prophylaxis: Lovenox Family Communication: none Disposition Plan: home with HHPT (refuses SNF)  Brendia Sacksaniel Goodrich, MD  [facility]  Pager [phone] If 7PM-7AM, please contact night-coverage at [email or web address], password [email or web address] 05/17/2014, 6:35 PM  LOS: 2 days   Consultants:  PT SNF, pt refuses  Procedures:    Antibiotics:  Vancomycin 5/2 >>   aztreonam 5/3 >>  HPI/Subjective: Still having some scrotal pain.  Objective: Filed Vitals:   05/16/14 1315 05/16/14 2100 05/17/14 0437 05/17/14 1452  BP: 138/109 158/71 161/69 127/71  Pulse: 75 63 57 65  Temp: 97.9 F (36.6 C) 98.3 F (36.8 C) 98 F (36.7 C) 98.2 F (36.8 C)  TempSrc: Oral Oral Oral Oral  Resp: 20 18 18 18   Height:      Weight:   78.699 kg (173 lb 8 oz)   SpO2: 97% 95% 96% 96%    Intake/Output Summary (Last 24 hours) at 05/17/14 1835 Last data filed at 05/17/14 1500  Gross per 24 hour  Intake 1362.5 ml  Output   2175 ml  Net -812.5 ml     Filed Weights   05/15/14 2016 05/16/14 0002 05/17/14 0437  Weight: 79.379 kg (175 lb) 79.107 kg (174 lb 6.4 oz) 78.699 kg (173 lb 8 oz)    Exam:    General:  Appears calm and comfortable Cardiovascular: RRR, no m/r/g. No LE edema. Respiratory: CTA bilaterally, no w/r/r. Normal respiratory effort. GU: Scrotum and penis edematous. Tender with minimal palpation.  Musculoskeletal: grossly normal tone BUE/BLE Psychiatric: grossly normal mood and affect, speech fluent and appropriate Neurologic: grossly non-focal.  New  data reviewed: Basic metabolic panel unremarkable    Scheduled Meds: . amLODipine  10 mg Oral Daily  . aspirin  81 mg Oral Daily  . aztreonam  1 g Intravenous 3 times per day  . enoxaparin (LOVENOX) injection  40 mg Subcutaneous QHS  . finasteride  5 mg Oral Daily  . nystatin   Topical TID  . omega-3 acid ethyl esters  1 g Oral BID  . sodium chloride  3 mL Intravenous Q12H  . tamsulosin  0.8 mg Oral QPC supper  . vancomycin  1,000 mg Intravenous Q24H   Continuous Infusions: . sodium chloride 75 mL/hr at 05/17/14 0845    Principal Problem:   Epididymoorchitis Active Problems:   Hypertension   A-fib   BPH (benign prostatic hypertrophy) with urinary retention   Cellulitis   Bradycardia   Time spent 20 minutes

## 2014-05-17 NOTE — Telephone Encounter (Signed)
Returned call to [employer] [facility] Spoke with Jerilynn Somalvin. He stated that the patient was [facility] ([facility]/14[facility] and [facility] observation and management of epididymitis (treated on current hospitalization). Authorization orders / paperwork to be faxed [facility] for my review and signature. He does not require any further information / orders from me at this time. Additionally, unable to view the [facility]. No other HH services ordered and no [facility].  Saralyn PilarAlexander Otie Headlee, DO [facility], PGY-2

## 2014-05-17 NOTE — Telephone Encounter (Signed)
Calling to let PCP know that the pt has been discharged from home health and would like to know if the PCP will be following the pt from this point on. Thanks HoneywellSadie Garner, ASA

## 2014-05-18 DIAGNOSIS — N400 Enlarged prostate without lower urinary tract symptoms: Secondary | ICD-10-CM

## 2014-05-18 LAB — URINE CULTURE

## 2014-05-18 MED ORDER — CEFUROXIME AXETIL 500 MG PO TABS
500.0000 mg | ORAL_TABLET | Freq: Two times a day (BID) | ORAL | Status: DC
  Filled 2014-05-18: qty 1

## 2014-05-18 MED ORDER — NYSTATIN 100000 UNIT/GM EX POWD: CUTANEOUS | Status: AC

## 2014-05-18 MED ORDER — CEFUROXIME AXETIL 500 MG PO TABS: 500.0000 mg | ORAL_TABLET | Freq: Two times a day (BID) | ORAL | Status: DC

## 2014-05-18 NOTE — Progress Notes (Signed)
  PROGRESS NOTE  Adam DecantRobert H Garner [id number] DOB: 04/05/1921 DOA: 05/15/2014 PCP: Adam PilarKaramalegos, Alexander, DO   Summary: 81 year old man, self catheterizes for BPH, presented with acute onset of redness and swelling of the testicles and penis.  Assessment/Plan: 1. Right-sided epididymoorchitis. Rapidly improving.  2. UTI. Change to oral antibiotics based on sensitivities. 3. Atrial fibrillation. Bradycardic at night, asymptomatic.  Not on rate-control agents. Discussed with cardiology--no indication for further evaluation. (Dr. Antoine Garner).  4. BPH, self-cath   Change to Ceftin  Discontinue telemetry.  Home today  Leave foley in place until f/u with urology  Discussed with his friend/living room  Adam Sacksaniel Bhavin Monjaraz, MD  [facility]  Pager [phone] If 7PM-7AM, please contact night-coverage at [email or web address], password [email or web address] 05/18/2014, 10:55 AM  LOS: 3 days   Consultants:  Urology   PT SNF, pt refuses  Procedures:    Antibiotics:  Vancomycin 5/2 >> 5/5  aztreonam 5/3 >> 5/5  Ceftin 5/5 >> 5/11  HPI/Subjective: No issues per nursing overnight.  Doing well, much less pain. Eating fine.  Objective: Filed Vitals:   05/17/14 1452 05/17/14 2026 05/18/14 0424 05/18/14 0527  BP: 127/71 161/70 153/57   Pulse: 65 77 50   Temp: 98.2 F (36.8 C) 97.7 F (36.5 C) 97.5 F (36.4 C)   TempSrc: Oral Oral Oral   Resp: 18 20 18    Height:      Weight:    81 kg (178 lb 9.2 oz)  SpO2: 96% 96% 98%     Intake/Output Summary (Last 24 hours) at 05/18/14 1055 Last data filed at 05/18/14 0945  Gross per 24 hour  Intake 1272.5 ml  Output   1955 ml  Net -682.5 ml     Filed Weights   05/16/14 0002 05/17/14 0437 05/18/14 0527  Weight: 79.107 kg (174 lb 6.4 oz) 78.699 kg (173 lb 8 oz) 81 kg (178 lb 9.2 oz)    Exam:    Afebrile, VSS. No hypoxia.  General:  Appears calm and comfortable Cardiovascular: RRR, no m/r/g.  Telemetry: SB, pauses <3 seconds Respiratory: CTA  bilaterally, no w/r/r. Normal respiratory effort. Abdomen: soft, ntnd Skin: no erythema of groin; scrotum and penis with minimal pinkness; no fluctuance; perineum appears normal. Some edema of scrotum and penis, but improved. Palpation and manipulation of scrotum for visual inspection very well tolerated. No skin breakdown or necrosis. Psychiatric: grossly normal mood and affect, speech fluent and appropriate   New data reviewed:  UOP 1955  Scheduled Meds: . amLODipine  10 mg Oral Daily  . aspirin  81 mg Oral Daily  . aztreonam  1 g Intravenous 3 times per day  . enoxaparin (LOVENOX) injection  40 mg Subcutaneous QHS  . finasteride  5 mg Oral Daily  . nystatin   Topical TID  . omega-3 acid ethyl esters  1 g Oral BID  . sodium chloride  3 mL Intravenous Q12H  . tamsulosin  0.8 mg Oral QPC supper  . vancomycin  1,000 mg Intravenous Q24H   Continuous Infusions:    Principal Problem:   Epididymoorchitis Active Problems:   Hypertension   A-fib   BPH (benign prostatic hypertrophy) with urinary retention   Cellulitis   Bradycardia

## 2014-05-18 NOTE — Care Management Note (Signed)
Case Management Note  Patient Details  Name: Adam Garner MRN: [id number] Date of Birth: 04/27/1921  Subjective/Objective:                    Action/Plan:05/18/14 [facility] rep Kristen aware of d/c, &  HHPT order.   Expected Discharge Date:  05/18/14               Expected Discharge Plan:  Home w Home Health Services  In-House Referral:     Discharge planning Services  CM Consult  Post Acute Care Choice:    Choice offered to:  Patient  DME Arranged:    DME Agency:     HH Arranged:  PT HH Agency:  [facility]  Status of Service:  Completed, signed off  Medicare Important Message Given:    Date Medicare IM Given:    Medicare IM give by:    Date Additional Medicare IM Given:    Additional Medicare Important Message give by:     If discussed at Long Length of Stay Meetings, dates discussed:    Additional Comments:  Lanier ClamMahabir, Eryka Dolinger, RN 05/18/2014, 2:29 PM

## 2014-05-18 NOTE — Discharge Summary (Signed)
Physician Discharge Summary  Adam Garner ZOX:[id number] DOB: November 22, 1921 DOA: 05/15/2014  PCP: Saralyn Pilar, DO  Admit date: 05/15/2014 Discharge date: 05/18/2014  Recommendations for Outpatient Follow-up:  1. Resolution of epididymoorchitis and UTI.  2. Foley catheter left in place for now, follow-up with urology; once penile swelling result can likely resume self-catheterization.    Follow-up Information    Call Garnett Farm, MD.   Specialty:  Urology   Why:  For an appointment in 1-2 weeks when you get home.   Contact information:   [address] [place] [address] [phone]       Follow up with [facility].   Why:  HHPT   Contact information:   [address] [place] [address] [phone]       Follow up with Garnett Farm, MD.   Specialty:  Urology   Why:  For an appointment in 1 week   Contact information:   [address] [place] [address] [phone]       Follow up with Saralyn Pilar, DO In 1 week.   Specialty:  Osteopathic Medicine   Contact information:   [address] [place] [address] [phone]      Discharge Diagnoses:  1. Right-sided epididymoorchitis 2. UTI Proteus mirabilis 3. Atrial fibrillation with slow ventricular response 4. BPH  Discharge Condition: improved Disposition: home  Diet recommendation: regular  Filed Weights   05/16/14 0002 05/17/14 0437 05/18/14 0527  Weight: 79.107 kg (174 lb 6.4 oz) 78.699 kg (173 lb 8 oz) 81 kg (178 lb 9.2 oz)    History of present illness:  81 year old man, self catheterizes for BPH, presented with acute onset of redness and swelling of the testicles and penis.  Hospital Course:  Mr. Vonbehren was placed on IV antibiotics and seen in consultation with urology. His condition gradually improved and he was cleared by urology for discharge. Foley catheter was left in place until he can follow-up and penile edema has completely  resolved. Antibiotics were narrowed based on culture data. His hospitalization was uncomplicated.   Right-sided epididymoorchitis. Rapidly improving.   UTI. Change to oral antibiotics based on sensitivities.  Atrial fibrillation. Bradycardic at night, asymptomatic. Not on rate-control agents. Discussed with cardiology--no indication for further evaluation. (Dr. Antoine Poche).   BPH, self-cath  Consultants:  Urology   PT SNF, pt refuses  Procedures:    Antibiotics:  Vancomycin 5/2 >> 5/5  aztreonam 5/3 >> 5/5  Ceftin 5/5 >> 5/11  Discharge Instructions  Discharge Instructions    Diet general    Complete by:  As directed      Increase activity slowly    Complete by:  As directed           Current Discharge Medication List    START taking these medications   Details  cefUROXime (CEFTIN) 500 MG tablet Take 1 tablet (500 mg total) by mouth 2 (two) times daily with a meal. Qty: 12 tablet, Refills: 0    nystatin (MYCOSTATIN/NYSTOP) 100000 UNIT/GM POWD Apply to scrotum TID Qty: 30 g, Refills: 0      CONTINUE these medications which have NOT CHANGED   Details  amLODipine (NORVASC) 10 MG tablet Take 1 tablet (10 mg total) by mouth daily. Qty: 90 tablet, Refills: 3    aspirin 81 MG tablet Take 81 mg by mouth daily.    finasteride (PROSCAR) 5 MG tablet Take 1 tablet (5 mg total) by mouth daily. Qty: 90 tablet,  Refills: 3   Associated Diagnoses: BPH (benign prostatic hyperplasia)    fish oil-omega-3 fatty acids 1000 MG capsule Take 1 g by mouth 2 (two) times daily.     Multiple Vitamin (MULTIVITAMIN WITH MINERALS) TABS tablet Take 1 tablet by mouth daily.    tamsulosin (FLOMAX) 0.4 MG CAPS capsule Take 2 capsules (0.8 mg total) by mouth daily after supper. Qty: 30 capsule, Refills: 6    traZODone (DESYREL) 50 MG tablet Take 1 tablet (50 mg total) by mouth at bedtime as needed for sleep. Qty: 30 tablet, Refills: 0       Allergies  Allergen Reactions  .  Penicillins     unknown  . Sulfonamide Derivatives     unknown    The results of significant diagnostics from this hospitalization (including imaging, microbiology, ancillary and laboratory) are listed below for reference.    Significant Diagnostic Studies: Koreas Scrotum  05/15/2014   CLINICAL DATA:  Right testicular pain and swelling for 2 days. Chills and leukocytosis.  EXAM: SCROTAL ULTRASOUND  DOPPLER ULTRASOUND OF THE TESTICLES  TECHNIQUE: Complete ultrasound examination of the testicles, epididymis, and other scrotal structures was performed. Color and spectral Doppler ultrasound were also utilized to evaluate blood flow to the testicles.  COMPARISON:  None.  FINDINGS: Right testicle  Measurements: 5.0 x 3.6 x 3.3 cm. The right testis is enlarged compared to the left. It is edematous with mildly inhomogeneous echotexture but no discrete mass. It is markedly hyperemic on Doppler.  Left testicle  Measurements: 4.5 x 2.6 x 2.7 cm. There are a few tiny benign-appearing cysts measuring 2-4 mm. No significant mass. Normal vascularity on Doppler.  Right epididymis: Enlarged and edematous with marked hyperemia on Doppler.  Left epididymis:  Normal in size and appearance.  Hydrocele: On the right there is a complex hydrocele with numerous internal septations. This could represent hematoma or scrotal abscess. On the left there is a small simple hydrocele.  Varicocele:  None visualized.  Pulsed Doppler interrogation of both testes demonstrates no evidence of portion. There is hyperemia of the right testis and right epididymis.  There is marked scrotal skin thickening on the right, up to 1.5 cm, with hyperemia.  IMPRESSION: The right testis and epididymis are enlarged, edematous and hyperemic. There is thickening and hyperemia of the scrotal skin, greatest on the right. There is a complicated right hydrocele. These findings may represent epididymo-orchitis, with scrotal cellulitis and possible scrotal abscess on the  right.   Electronically Signed   By: Ellery Plunkaniel R Mitchell M.D.   On: 05/15/2014 22:29   Koreas Art/ven Flow Abd Pelv Doppler  05/15/2014   CLINICAL DATA:  Right testicular pain and swelling for 2 days. Chills and leukocytosis.  EXAM: SCROTAL ULTRASOUND  DOPPLER ULTRASOUND OF THE TESTICLES  TECHNIQUE: Complete ultrasound examination of the testicles, epididymis, and other scrotal structures was performed. Color and spectral Doppler ultrasound were also utilized to evaluate blood flow to the testicles.  COMPARISON:  None.  FINDINGS: Right testicle  Measurements: 5.0 x 3.6 x 3.3 cm. The right testis is enlarged compared to the left. It is edematous with mildly inhomogeneous echotexture but no discrete mass. It is markedly hyperemic on Doppler.  Left testicle  Measurements: 4.5 x 2.6 x 2.7 cm. There are a few tiny benign-appearing cysts measuring 2-4 mm. No significant mass. Normal vascularity on Doppler.  Right epididymis: Enlarged and edematous with marked hyperemia on Doppler.  Left epididymis:  Normal in size and appearance.  Hydrocele: On the  right there is a complex hydrocele with numerous internal septations. This could represent hematoma or scrotal abscess. On the left there is a small simple hydrocele.  Varicocele:  None visualized.  Pulsed Doppler interrogation of both testes demonstrates no evidence of portion. There is hyperemia of the right testis and right epididymis.  There is marked scrotal skin thickening on the right, up to 1.5 cm, with hyperemia.  IMPRESSION: The right testis and epididymis are enlarged, edematous and hyperemic. There is thickening and hyperemia of the scrotal skin, greatest on the right. There is a complicated right hydrocele. These findings may represent epididymo-orchitis, with scrotal cellulitis and possible scrotal abscess on the right.   Electronically Signed   By: Ellery Plunk M.D.   On: 05/15/2014 22:29   Dg Chest Port 1 View  05/15/2014   CLINICAL DATA:  Right testicle  pain.  EXAM: PORTABLE CHEST - 1 VIEW  COMPARISON:  10/26/2012  FINDINGS: Hypoventilation with interstitial crowding at the bases. Stable mild cardiomegaly and aortic tortuosity. There is no edema, consolidation, effusion, or pneumothorax.  IMPRESSION: Chronic cardiomegaly and hypoventilation. No change from 2014 to suggest acute disease.   Electronically Signed   By: Marnee Spring M.D.   On: 05/15/2014 20:42    Microbiology: Recent Results (from the past 240 hour(s))  Blood Culture (routine x 2)     Status: None (Preliminary result)   Collection Time: 05/15/14  8:14 PM  Result Value Ref Range Status   Specimen Description BLOOD RIGHT ANTECUBITAL  Final   Special Requests BOTTLES DRAWN AEROBIC AND ANAEROBIC  Final   Culture   Final           BLOOD CULTURE RECEIVED NO GROWTH TO DATE CULTURE WILL BE HELD FOR 5 DAYS BEFORE ISSUING A FINAL NEGATIVE REPORT Performed at [facility]    Report Status PENDING  Incomplete  Blood Culture (routine x 2)     Status: None (Preliminary result)   Collection Time: 05/15/14  8:17 PM  Result Value Ref Range Status   Specimen Description BLOOD LEFT ANTECUBITAL  Final   Special Requests BOTTLES DRAWN AEROBIC AND ANAEROBIC 5CC  Final   Culture   Final           BLOOD CULTURE RECEIVED NO GROWTH TO DATE CULTURE WILL BE HELD FOR 5 DAYS BEFORE ISSUING A FINAL NEGATIVE REPORT Performed at [facility]    Report Status PENDING  Incomplete  Urine culture     Status: None   Collection Time: 05/15/14  8:35 PM  Result Value Ref Range Status   Specimen Description URINE, CLEAN CATCH  Final   Special Requests NONE  Final   Colony Count   Final    >=100,000 COLONIES/ML Performed at [facility]    Culture   Final    PROTEUS MIRABILIS Performed at [facility]    Report Status 05/18/2014 FINAL  Final   Organism ID, Bacteria PROTEUS MIRABILIS  Final      Susceptibility   Proteus mirabilis - MIC*    AMPICILLIN RESISTANT       CEFAZOLIN <=4 SENSITIVE Sensitive     CEFTRIAXONE <=1 SENSITIVE Sensitive     CIPROFLOXACIN <=0.25 SENSITIVE Sensitive     GENTAMICIN <=1 SENSITIVE Sensitive     LEVOFLOXACIN <=0.12 SENSITIVE Sensitive     NITROFURANTOIN 128 RESISTANT Resistant     TOBRAMYCIN <=1 SENSITIVE Sensitive     TRIMETH/SULFA <=20 SENSITIVE Sensitive     PIP/TAZO <=4  SENSITIVE Sensitive     * PROTEUS MIRABILIS     Labs: Basic Metabolic Panel:  Recent Labs Lab 05/15/14 2017 05/16/14 0448 05/17/14 0513  NA 136 139 135  K 3.7 3.1* 3.9  CL 104 107 108  CO2 23 24 23   GLUCOSE 129* 111* 116*  BUN 28* 25* 15  CREATININE 1.15 1.13 1.00  CALCIUM 9.2 9.0 8.3*   Liver Function Tests:  Recent Labs Lab 05/15/14 2017 05/16/14 0448  AST 75* 62*  ALT 36 36  ALKPHOS 74 71  BILITOT 1.2 1.1  PROT 7.0 6.7  ALBUMIN 3.7 3.5   CBC:  Recent Labs Lab 05/15/14 2017 05/16/14 0448  WBC 13.6* 8.8  NEUTROABS 11.6* 6.9  HGB 14.9 14.7  HCT 43.8 43.3  MCV 89.8 89.6  PLT 214 187   Cardiac Enzymes:  Recent Labs Lab 05/16/14 1307  TROPONINI 0.03    Principal Problem:   Epididymoorchitis Active Problems:   Hypertension   A-fib   BPH (benign prostatic hypertrophy) with urinary retention   Cellulitis   Bradycardia   Time coordinating discharge: 35 minutes  Signed:  Brendia Sacksaniel Theophile Harvie, MD [facility] 05/18/2014, 1:58 PM

## 2014-05-18 NOTE — Progress Notes (Signed)
Physical Therapy Treatment Patient Details Name: Adam Garner MRN: [id number] DOB: 07/15/1921 Today's Date: 05/18/2014    History of Present Illness  Pt is a 81 year old male admitted for Epididymoorchitis with hx of afib, BPH, Parkinsonism, macular degeneration    PT Comments    PT in bed with HOB elevated and spouse bedside; Assisted pt OOB to EOB pt required some assistance with LE and hand given to pull up to steady, discomfort with scooting to EOB due to scrotal area; placed shoes on feet, while sitting EOB pt stated " I walked without walker" used walker still for safety; sit to stand via walker to ambulate; Ambulate in unit hall and then briefly ambulate without walker pt required VC to stay close to walker and posture correctness; returned back to room into chair.   Follow Up Recommendations  Home health PT     Equipment Recommendations  3in1 (PT)    Recommendations for Other Services       Precautions / Restrictions Precautions Precautions: Fall Precaution Comments: Parkinson's/Macular Restrictions Weight Bearing Restrictions: No    Mobility  Bed Mobility Overal bed mobility: Needs Assistance Bed Mobility: Supine to Sit     Supine to sit: Mod assist     General bed mobility comments: Pt able to use UE and move LE to get to EOB; grimacing in discomfort due to scrotal area; assisted w scooting Hips with bed pad.   Transfers Overall transfer level: Needs assistance Equipment used: Rolling walker (2 wheeled) Transfers: Sit to/from Stand Sit to Stand: Min assist         General transfer comment: verbal cues for safe technique, assist to rise and steady as well as control descent  Ambulation/Gait Ambulation/Gait assistance: Min guard Ambulation Distance (Feet): 130 Feet (15-20 ft without walker) Assistive device: Rolling walker (2 wheeled) Gait Pattern/deviations: Step-through pattern;Decreased stride length Gait velocity: decreased but functional    General Gait Details: VC for staying closer to walker and positioning; without walker more unsteady; walker for safety    Stairs            Wheelchair Mobility    Modified Rankin (Stroke Patients Only)       Balance                                    Cognition Arousal/Alertness: Awake/alert Behavior During Therapy: WFL for tasks assessed/performed Overall Cognitive Status: Within Functional Limits for tasks assessed                      Exercises      General Comments        Pertinent Vitals/Pain Pain Assessment: No/denies pain Pain Location: In scrotal area Pain Descriptors / Indicators: Discomfort (When scooting ) Pain Intervention(s): Monitored during session;Repositioned    Home Living                      Prior Function            PT Goals (current goals can now be found in the care plan section) Progress towards PT goals: Progressing toward goals    Frequency  Min 3X/week    PT Plan      Co-evaluation             End of Session Equipment Utilized During Treatment: Gait belt Activity Tolerance: Patient tolerated treatment well Patient left: in  chair;with call bell/phone within reach;with family/visitor present     Time: 1002-1028 PT Time Calculation (min) (ACUTE ONLY): 26 min  Charges:  $Gait Training: 8-22 mins $Therapeutic Activity: 8-22 mins                    G Codes:      Adam Garner,Adam Garner Student PTA 05/18/2014, 1:14 PM   Adam Garner  PTA [facility]  Acute  Rehab Pager      [phone]

## 2014-05-18 NOTE — Progress Notes (Signed)
Patient had unsustained rates of 30s - 40s. Also, pause of 2.62 seconds. Patient's vitals were 97.42F;HR 50;RR 18;B/P 153/57;98% Room Air SATS.  PCP was notified. Will continue to monitor the patient .

## 2014-05-19 ENCOUNTER — Telehealth: Payer: Self-pay | Admitting: Family Medicine

## 2014-05-19 DIAGNOSIS — N453 Epididymo-orchitis: Secondary | ICD-10-CM

## 2014-05-19 NOTE — Telephone Encounter (Signed)
Referral order placed - to re-establish care with [facility] (Dr. Vernie Ammonsttelin) has hospital follow-up appointment for epididymoorchitis, he already scheduled (but needed referral) for May 17 @ 12:45pm. History of BPH and chronic self intermittent catheters.  Saralyn PilarAlexander Karamalegos, DO [facility], PGY-2

## 2014-05-19 NOTE — Telephone Encounter (Signed)
Pt [facility] and he had an infection of the right testicle. He was instructed to follow up with his urologist, and he has an appt on May 17th @ 12:45pm. He is in need of a referral to see Dr. Vernie Ammonsttelin with [facility], Ph: [phone]; address: [address], [place], [place] [address]. Thanks, Honey[employer]Sadie Reynolds, ASA

## 2014-05-22 LAB — CULTURE, BLOOD (ROUTINE X 2)
Culture: NO GROWTH
Culture: NO GROWTH

## 2014-05-25 ENCOUNTER — Ambulatory Visit (INDEPENDENT_AMBULATORY_CARE_PROVIDER_SITE_OTHER): Payer: PPO | Admitting: Family Medicine

## 2014-05-25 ENCOUNTER — Encounter: Payer: Self-pay | Admitting: Family Medicine

## 2014-05-25 VITALS — BP 140/65 | HR 50 | Temp 97.8°F | Ht 68.0 in | Wt 172.9 lb

## 2014-05-25 DIAGNOSIS — N401 Enlarged prostate with lower urinary tract symptoms: Secondary | ICD-10-CM

## 2014-05-25 DIAGNOSIS — N400 Enlarged prostate without lower urinary tract symptoms: Secondary | ICD-10-CM | POA: Diagnosis not present

## 2014-05-25 DIAGNOSIS — N453 Epididymo-orchitis: Secondary | ICD-10-CM

## 2014-05-25 DIAGNOSIS — R338 Other retention of urine: Secondary | ICD-10-CM

## 2014-05-25 NOTE — Patient Instructions (Signed)
Thank you for coming in, today!  Everything looks and sounds like it's improving. Keep taking the cefuroxime until it's finished, and keep using the nystatin, as well. Make sure you follow up with Dr. Vernie Ammonsttelin next week.  Until you can get set up with [facility] again, you can come back to see Dr. Kirtland BouchardK or any of us as you need. Please feel free to call with any questions or concerns at any time, at [phone]. --Dr. Casper HarrisonStreet

## 2014-05-25 NOTE — Progress Notes (Signed)
   Subjective:    Patient ID: Felicie Mornobert H Choplin, male    DOB: 08/27/1921, 81 y.o.   MRN: [id number]  HPI: Pt presents to clinic for hospital follow-up from 5/2 to 5/5 for epididymoorchitis and Proteus UTI. He is accompanied by a close friend who is also a caregiver. He had significant penile and scrotal swelling and pain, both of which are improving. He required IV abx and catheterization; he was switched to oral antibiotics, which he is still taking. He had a foley catheter left in place until f/u with urology; his urine has been progressively lighter / clearer and does not have any particular odor. He has a history of BPH and did self-caths previously. He is seeing Dr. Vernie Ammonsttelin in follow-up next week to discuss removing the foley and / or doing a surgery of some kind for BPH.  He has a history of afib, and reports no chest pain, rapid heartbeat, shortness of breath, or leg swelling. He has had no fevers. He is compliant with his normal medications. Caregiver reports he was previously seen by [facility] and he is interested in re-establishing with them due to driving distance and convenience of seeing one provider regularly as opposed to different residents (pt's previous  physician retired and he was "automatically sent here" after that).  Review of Systems: As above.     Objective:   Physical Exam BP 140/65 mmHg  Pulse 50  Temp(Src) 97.8 F (36.6 C) (Oral)  Ht 5\' 8"  (1.727 m)  Wt 172 lb 14.4 oz (78.427 kg)  BMI 26.30 kg/m2 Gen: well-appearing elderly gentleman, in NAD HEENT: [place]/AT, EOMI, PERRLA, MMM Cardio: irregularly irregualr, no murmur appreciated Pulm: CTAB, no wheezes, normal WOB Ext: warm, well-perfused, no LE edema GU: foley catheter in place, draining clear yellow urine  Some penile and scrotal edema remains  No frank erythema or induration noted  No tenderness of penis, scrotum, or testicles noted  Bilateral testicles firm without frank masses noted       Assessment & Plan:  81yo male with improving epididymoorchitis and Proteus UTI in context of severe BPH (hx of requiring intermittent self-caths) - infection improving on cefuroxime; advised completion of abx and continued use of nystatin powder for the groin, as well - foley functioning properly, to be left in place until urology f/u next week - defer to Dr. Vernie Ammonsttelin any changes in meds / therapy for BPH as well as further discussion of surgery (or not) - f/u with Dr. Althea CharonKaramalegos otherwise, as needed  Note FYI to Dr. Kirtland BouchardK. Of note, pt expressed interest in re-establishing with a different physician at his prior PCP's office. - instructed caregiver to call prior office to see about establishing there, for convenience - advised pt and caregiver that they can call / f/u with us at any time in between now and re-establishing at another office  Bobbye Mortonhristopher M Jamarious Febo, MD PGY-3, [facility] 05/25/2014, 12:14 PM

## 2014-06-06 ENCOUNTER — Encounter: Payer: Self-pay | Admitting: Internal Medicine

## 2014-06-06 ENCOUNTER — Ambulatory Visit (INDEPENDENT_AMBULATORY_CARE_PROVIDER_SITE_OTHER): Payer: PPO | Admitting: Internal Medicine

## 2014-06-06 VITALS — BP 128/70 | HR 46 | Ht 67.0 in | Wt 170.0 lb

## 2014-06-06 DIAGNOSIS — F02818 Dementia in other diseases classified elsewhere, unspecified severity, with other behavioral disturbance: Secondary | ICD-10-CM

## 2014-06-06 DIAGNOSIS — I10 Essential (primary) hypertension: Secondary | ICD-10-CM

## 2014-06-06 DIAGNOSIS — I482 Chronic atrial fibrillation, unspecified: Secondary | ICD-10-CM

## 2014-06-06 DIAGNOSIS — F0281 Dementia in other diseases classified elsewhere with behavioral disturbance: Secondary | ICD-10-CM | POA: Diagnosis not present

## 2014-06-06 DIAGNOSIS — F0280 Dementia in other diseases classified elsewhere without behavioral disturbance: Secondary | ICD-10-CM | POA: Insufficient documentation

## 2014-06-06 DIAGNOSIS — R001 Bradycardia, unspecified: Secondary | ICD-10-CM

## 2014-06-06 NOTE — Patient Instructions (Signed)
Your physician recommends that you schedule a follow-up appointment in: 6 months with Dr.Hilty  

## 2014-06-06 NOTE — Progress Notes (Signed)
OFFICE NOTE  Chief Complaint:  Preoperative clearance  Primary Care Physician: Saralyn PilarKaramalegos, Alexander, DO  HPI:  Adam Garner is a 81 year old male who has a history of permanent atrial fibrillation. He's not currently on anticoagulation although his CHADSVASC score is at least 3. He has a history of falls and apparently parkinsonism with dementia and recent behavioral disturbances. He was in the hospital recently for UTI/testicular infection. He followed up with a urologist who was planning on surgery however felt not to be a candidate. He also has cataracts and is contemplating surgery but was told he needed preoperative risk assessment. His A. fib is quite slow in fact his ventricular rate is in the 40s today. He reports some fatigue. He denies any chest pain or shortness of breath. He is not on any AV nodal blocking medicines. We talked about possible treatments for slow A. fib and he was very clear that he did not want a pacemaker. He asked whether he would be a candidate for home hospice therapy, but it was not clear to me that he has a prognosis of less than 6 months. He and his wife were of the opinion that they were seeing me today as her new primary care provider and I corrected them that they would be seeing Dr. Dorise HissKollar next week with Adam Garner primary care.  PMHx:  Past Medical History  Diagnosis Date  . Osteoporosis   . BPH (benign prostatic hyperplasia)   . Hyperlipidemia   . BPH (benign prostatic hyperplasia)   . Anemia   . Macular degeneration   . Atrial fibrillation   . Parkinsonism     Past Surgical History  Procedure Laterality Date  . Tonsilectomy/adenoidectomy with myringotomy      FAMHx:  Family History  Problem Relation Age of Onset  . Heart disease Mother   . Heart disease Father     SOCHx:   reports that he has never smoked. He does not have any smokeless tobacco history on file. He reports that he does not drink alcohol or use illicit  drugs.  ALLERGIES:  Allergies  Allergen Reactions  . Penicillins     unknown  . Sulfonamide Derivatives     unknown    ROS: A comprehensive review of systems was negative except for: Constitutional: positive for fatigue Behavioral/Psych: positive for aggressive behavior and Memory problems  HOME MEDS: Current Outpatient Prescriptions  Medication Sig Dispense Refill  . amLODipine (NORVASC) 10 MG tablet Take 1 tablet (10 mg total) by mouth daily. 90 tablet 3  . aspirin 81 MG tablet Take 81 mg by mouth daily.    . finasteride (PROSCAR) 5 MG tablet Take 1 tablet (5 mg total) by mouth daily. 90 tablet 3  . fish oil-omega-3 fatty acids 1000 MG capsule Take 1 g by mouth 2 (two) times daily.     . Multiple Vitamin (MULTIVITAMIN WITH MINERALS) TABS tablet Take 1 tablet by mouth daily.    Marland Kitchen. nystatin (MYCOSTATIN/NYSTOP) 100000 UNIT/GM POWD Apply to scrotum TID 30 g 0  . tamsulosin (FLOMAX) 0.4 MG CAPS capsule Take 2 capsules (0.8 mg total) by mouth daily after supper. 30 capsule 6   No current facility-administered medications for this visit.    LABS/IMAGING: No results found for this or any previous visit (from the past 48 hour(s)). No results found.  WEIGHTS: Wt Readings from Last 3 Encounters:  06/06/14 170 lb (77.111 kg)  05/25/14 172 lb 14.4 oz (78.427 kg)  05/18/14 178 lb 9.2  oz (81 kg)    VITALS: BP 128/70 mmHg  Pulse 46  Ht  (1.702 m)  Wt 170 lb (77.111 kg)  BMI 26.62 kg/m2  EXAM: General appearance: alert and no distress Neck: no carotid bruit and no JVD Lungs: Clear Heart: Irregularly irregular and markedly bradycardic Abdomen: soft, non-tender; bowel sounds normal; no masses,  no organomegaly Extremities: extremities normal, atraumatic, no cyanosis or edema Pulses: 2+ and symmetric Skin: Skin color, texture, turgor normal. No rashes or lesions Neurologic: Mental status: Alert, not oriented to place or year, knows self Psych: Pleasant, but at times became  verbally angry at his wife  EKG: A. fib with slow ventricular response at 46  ASSESSMENT: 1. Permanent atrial fibrillation with slow ventricular response 2. CHADSVASC 3-on aspirin due to high fall risk 3. Hypertension 4. Progressive dementia 5. Low risk for cataract surgery  PLAN: 1.   Mr. Mineo is at low risk for cataract surgery. I do not see any contraindication for this. He does have slow A. fib without any AV nodal blocking medicines. This suggests sinus node dysfunction and given his symptoms of fatigue and shortness of breath, could potentially be improved with a pacemaker. He seems to be adamantly against this, however. I'm happy to see him back in the office in follow-up but would recommend no changes to his current medicines. He reports that he does have an advanced directive and does not want to be resuscitated.  Follow-up in 6 months.  Chrystie Nose, MD, [facility] Attending Cardiologist [facility]  Chrystie Nose 06/06/2014, 4:58 PM

## 2014-06-14 ENCOUNTER — Other Ambulatory Visit: Payer: Self-pay | Admitting: Family Medicine

## 2014-06-14 DIAGNOSIS — N401 Enlarged prostate with lower urinary tract symptoms: Secondary | ICD-10-CM

## 2014-06-14 DIAGNOSIS — R338 Other retention of urine: Principal | ICD-10-CM

## 2014-06-14 MED ORDER — TAMSULOSIN HCL 0.4 MG PO CAPS: 0.8000 mg | ORAL_CAPSULE | Freq: Every day | ORAL | Status: DC

## 2014-06-14 NOTE — Telephone Encounter (Signed)
Needs refill on generic flomax [facility] Carney BernJean would like to talk to Dr Casper HarrisonStreet about pf [phone]

## 2014-06-14 NOTE — Telephone Encounter (Signed)
Pt is out of medication per Pharmacist.  Clovis PuMartin, Machi Whittaker L, RN

## 2014-06-14 NOTE — Telephone Encounter (Signed)
3rd request. Kenny Rea L, RN  

## 2014-06-15 ENCOUNTER — Emergency Department (HOSPITAL_COMMUNITY)
Admission: EM | Admit: 2014-06-15 | Discharge: 2014-06-15 | Disposition: A | Discharge status: Home / Self Care | Payer: PPO | Attending: Emergency Medicine | Admitting: Emergency Medicine

## 2014-06-15 ENCOUNTER — Encounter (HOSPITAL_COMMUNITY): Payer: Self-pay | Admitting: Emergency Medicine

## 2014-06-15 ENCOUNTER — Emergency Department (HOSPITAL_COMMUNITY): Payer: PPO

## 2014-06-15 DIAGNOSIS — Z862 Personal history of diseases of the blood and blood-forming organs and certain disorders involving the immune mechanism: Secondary | ICD-10-CM | POA: Diagnosis not present

## 2014-06-15 DIAGNOSIS — Z8639 Personal history of other endocrine, nutritional and metabolic disease: Secondary | ICD-10-CM | POA: Diagnosis not present

## 2014-06-15 DIAGNOSIS — W19XXXA Unspecified fall, initial encounter: Secondary | ICD-10-CM

## 2014-06-15 DIAGNOSIS — N400 Enlarged prostate without lower urinary tract symptoms: Secondary | ICD-10-CM | POA: Insufficient documentation

## 2014-06-15 DIAGNOSIS — G20 Parkinson's disease: Secondary | ICD-10-CM | POA: Insufficient documentation

## 2014-06-15 DIAGNOSIS — N390 Urinary tract infection, site not specified: Secondary | ICD-10-CM

## 2014-06-15 DIAGNOSIS — Z043 Encounter for examination and observation following other accident: Secondary | ICD-10-CM | POA: Insufficient documentation

## 2014-06-15 DIAGNOSIS — I499 Cardiac arrhythmia, unspecified: Secondary | ICD-10-CM | POA: Diagnosis not present

## 2014-06-15 DIAGNOSIS — Z7982 Long term (current) use of aspirin: Secondary | ICD-10-CM | POA: Diagnosis not present

## 2014-06-15 DIAGNOSIS — R531 Weakness: Secondary | ICD-10-CM | POA: Diagnosis not present

## 2014-06-15 DIAGNOSIS — Z880 Allergy status to penicillin: Secondary | ICD-10-CM | POA: Diagnosis not present

## 2014-06-15 DIAGNOSIS — Z79899 Other long term (current) drug therapy: Secondary | ICD-10-CM | POA: Insufficient documentation

## 2014-06-15 DIAGNOSIS — W1830XA Fall on same level, unspecified, initial encounter: Secondary | ICD-10-CM | POA: Insufficient documentation

## 2014-06-15 DIAGNOSIS — T83511A Infection and inflammatory reaction due to indwelling urethral catheter, initial encounter: Secondary | ICD-10-CM

## 2014-06-15 LAB — COMPREHENSIVE METABOLIC PANEL WITH GFR
ALT: 54 U/L (ref 17–63)
AST: 80 U/L — ABNORMAL HIGH (ref 15–41)
Albumin: 3 g/dL — ABNORMAL LOW (ref 3.5–5.0)
Alkaline Phosphatase: 85 U/L (ref 38–126)
Anion gap: 11 (ref 5–15)
BUN: 28 mg/dL — ABNORMAL HIGH (ref 6–20)
CO2: 23 mmol/L (ref 22–32)
Calcium: 9.4 mg/dL (ref 8.9–10.3)
Chloride: 104 mmol/L (ref 101–111)
Creatinine, Ser: 1.24 mg/dL (ref 0.61–1.24)
GFR calc Af Amer: 57 mL/min — ABNORMAL LOW
GFR calc non Af Amer: 49 mL/min — ABNORMAL LOW
Glucose, Bld: 113 mg/dL — ABNORMAL HIGH (ref 65–99)
Potassium: 3.4 mmol/L — ABNORMAL LOW (ref 3.5–5.1)
Sodium: 138 mmol/L (ref 135–145)
Total Bilirubin: 0.9 mg/dL (ref 0.3–1.2)
Total Protein: 6.9 g/dL (ref 6.5–8.1)

## 2014-06-15 LAB — CBC WITH DIFFERENTIAL/PLATELET
Basophils Absolute: 0 10*3/uL (ref 0.0–0.1)
Basophils Relative: 0 % (ref 0–1)
Eosinophils Absolute: 0.1 10*3/uL (ref 0.0–0.7)
Eosinophils Relative: 1 % (ref 0–5)
HCT: 41.9 % (ref 39.0–52.0)
Hemoglobin: 14.6 g/dL (ref 13.0–17.0)
Lymphocytes Relative: 14 % (ref 12–46)
Lymphs Abs: 1.3 10*3/uL (ref 0.7–4.0)
MCH: 29.6 pg (ref 26.0–34.0)
MCHC: 34.8 g/dL (ref 30.0–36.0)
MCV: 85 fL (ref 78.0–100.0)
MONO ABS: 1.3 10*3/uL — AB (ref 0.1–1.0)
Monocytes Relative: 14 % — ABNORMAL HIGH (ref 3–12)
Neutro Abs: 6.4 10*3/uL (ref 1.7–7.7)
Neutrophils Relative %: 71 % (ref 43–77)
PLATELETS: 196 10*3/uL (ref 150–400)
RBC: 4.93 MIL/uL (ref 4.22–5.81)
RDW: 13.2 % (ref 11.5–15.5)
WBC: 9 10*3/uL (ref 4.0–10.5)

## 2014-06-15 LAB — URINALYSIS, ROUTINE W REFLEX MICROSCOPIC
Bilirubin Urine: NEGATIVE
Glucose, UA: 100 mg/dL — AB
Hgb urine dipstick: NEGATIVE
Ketones, ur: NEGATIVE mg/dL
Nitrite: NEGATIVE
Protein, ur: >300 mg/dL — AB
Specific Gravity, Urine: 1.015 (ref 1.005–1.030)
Urobilinogen, UA: 0.2 mg/dL (ref 0.0–1.0)
pH: 8.5 — ABNORMAL HIGH (ref 5.0–8.0)

## 2014-06-15 LAB — URINE MICROSCOPIC-ADD ON

## 2014-06-15 MED ORDER — LEVOFLOXACIN 750 MG PO TABS: 750.0000 mg | ORAL_TABLET | Freq: Every day | ORAL | Status: AC

## 2014-06-15 NOTE — Discharge Instructions (Signed)
Levaquin as prescribed.  Return to the emergency department if you experience any new and concerning symptoms.   Urinary Tract Infection A urinary tract infection (UTI) can occur any place along the urinary tract. The tract includes the kidneys, ureters, bladder, and urethra. A type of germ called bacteria often causes a UTI. UTIs are often helped with antibiotic medicine.  HOME CARE   If given, take antibiotics as told by your doctor. Finish them even if you start to feel better.  Drink enough fluids to keep your pee (urine) clear or pale yellow.  Avoid tea, drinks with caffeine, and bubbly (carbonated) drinks.  Pee often. Avoid holding your pee in for a long time.  Pee before and after having sex (intercourse).  Wipe from front to back after you poop (bowel movement) if you are a woman. Use each tissue only once. GET HELP RIGHT AWAY IF:   You have back pain.  You have lower belly (abdominal) pain.  You have chills.  You feel sick to your stomach (nauseous).  You throw up (vomit).  Your burning or discomfort with peeing does not go away.  You have a fever.  Your symptoms are not better in 3 days. MAKE SURE YOU:   Understand these instructions.  Will watch your condition.  Will get help right away if you are not doing well or get worse. Document Released: 06/18/2007 Document Revised: 09/24/2011 Document Reviewed: 07/31/2011 [facility] Patient Information 2015 [place], [place]. This information is not intended to replace advice given to you by your health care provider. Make sure you discuss any questions you have with your health care provider.  Weakness Weakness is a lack of strength. It may be felt all over the body (generalized) or in one specific part of the body (focal). Some causes of weakness can be serious. You may need further medical evaluation, especially if you are elderly or you have a history of immunosuppression (such as chemotherapy or HIV), kidney  disease, heart disease, or diabetes. CAUSES  Weakness can be caused by many different things, including:  Infection.  Physical exhaustion.  Internal bleeding or other blood loss that results in a lack of red blood cells (anemia).  Dehydration. This cause is more common in elderly people.  Side effects or electrolyte abnormalities from medicines, such as pain medicines or sedatives.  Emotional distress, anxiety, or depression.  Circulation problems, especially severe peripheral arterial disease.  Heart disease, such as rapid atrial fibrillation, bradycardia, or heart failure.  Nervous system disorders, such as Guillain-Barr syndrome, multiple sclerosis, or stroke. DIAGNOSIS  To find the cause of your weakness, your caregiver will take your history and perform a physical exam. Lab tests or X-rays may also be ordered, if needed. TREATMENT  Treatment of weakness depends on the cause of your symptoms and can vary greatly. HOME CARE INSTRUCTIONS   Rest as needed.  Eat a well-balanced diet.  Try to get some exercise every day.  Only take over-the-counter or prescription medicines as directed by your caregiver. SEEK MEDICAL CARE IF:   Your weakness seems to be getting worse or spreads to other parts of your body.  You develop new aches or pains. SEEK IMMEDIATE MEDICAL CARE IF:   You cannot perform your normal daily activities, such as getting dressed and feeding yourself.  You cannot walk up and down stairs, or you feel exhausted when you do so.  You have shortness of breath or chest pain.  You have difficulty moving parts of your body.  You have weakness in only one area of the body or on only one side of the body.  You have a fever.  You have trouble speaking or swallowing.  You cannot control your bladder or bowel movements.  You have black or bloody vomit or stools. MAKE SURE YOU:  Understand these instructions.  Will watch your condition.  Will get help  right away if you are not doing well or get worse. Document Released: 12/30/2004 Document Revised: 07/01/2011 Document Reviewed: 02/28/2011 [facility] Patient Information 2015 [place], [place]. This information is not intended to replace advice given to you by your health care provider. Make sure you discuss any questions you have with your health care provider.

## 2014-06-15 NOTE — Care Management Note (Signed)
Case Management Note  Patient Details  Name: Adam Garner MRN: [id number] Date of Birth: 12/09/1921  Subjective/Objective:  Patient presents to Ed after fall at home.  Patient [facility] May.                   Action/Plan:  Discussed home health services options, private duty nursing services and home hospice/palliative care   Expected Discharge Date:                  Expected Discharge Plan:  Home w Hospice Care  In-House Referral:     Discharge planning Services     Post Acute Care Choice:  [facility]osp[facility]:  [facility] / Guardian, Patient  DME Arranged:   (No dme needed per patient and POA Adam Garner) DME Agency:     HH Arranged:    HH Agency:     Status of Service:  Completed, signed off  Medicare Important Message Given:   offered to send Date Medicare IM Given:    Medicare IM give by:    Date Additional Medicare IM Given:    Additional Medicare Important Message give by:     If discussed at Long Length of Stay Meetings, dates discussed:    Additional Comments:  EDCM spoke to patient and POA Adam Garner at bedside.  Adam's phone number [phone] or [phone].  Patient lives at home with his POA Adam Garner.  Adam Garner reports patient has an appointment with Dr. Dorise HissKollar on June 30th.  Adam Garner reports Dr. Warner Mccreedytlin (urology) has sent message to Dr. Dorise HissKollar requesting patient to be seen sooner.  Patient and Adam Garner are very interested in hospice care at a facility.  EDCM offered to send referral to hospice of their choice to come and evaluate patient for hospice/palliative care.  Patient and Adam Garner are agreeable to this, however they would like patient to be in a hospice facility.  EDCM informed Adam Garner and patient of criteria needed to be accepted to hospice facility.  EDCM informed patient and Adam Garner that when hospice of their choice completes assessment, they will inform them of their choices.  EDCM explained to patient and Adam Garner if patient is not a candidate for hospice/palliative  care, will need to call patient's pcp to arrange home health services as Adam Garner reports she is having difficulty taking care of patient at home.  Patient and Adam Garner are refusing home health services at this time, as they wish to speak to pcp first.  Adam Garner has also refused private duty nursing agency list.  Patient adamantly refuses to go to a SNF.  Patient has a walker at home.  EDCM assessed for further dme needs.  Both patient and Adam Garner report patient does not need any further dme.  [facility] for d[facility] needs such as wheelchair or hospital bed.  Again both patient and Adam Garner refused.  Adam Garner reports she has been assisting patient with ADL's and meals.  EDCM provided Adam Garner with list of home hospice agencies of which Adam Garner chose [facility].  EDCM will send referral to [facility].  [facility] consult placed.  No further EDCM needs at this time.  Bennie DallasFERRERO, Jessika Rothery, RN 06/15/2014, 8:05 PM

## 2014-06-15 NOTE — ED Provider Notes (Signed)
CSN: [id number]     Arrival date & time 06/15/14  1312 History   First MD Initiated Contact with Patient 06/15/14 1339     Chief Complaint  Patient presents with  . Fall     (Consider location/radiation/quality/duration/timing/severity/associated sxs/prior Treatment) HPI Comments: Patient is an 81 year old male with past medical history of Parkinson's disease, atrial fibrillation, and BPH with indwelling Foley catheter. He was recently admitted for a urinary tract infection approximately one month ago. Since that time, the patient's friend who is his caretaker and present at bedside states that he has been more confused, weaker, and actually fell 2 days ago. She states that he fell forward onto his head and face with no loss of consciousness. He denies any headache or neck pain. He denies any fevers or chills. He was just seen in the urology clinic and sent here for further evaluation of his other medical problems.  History is somewhat limited due to his history of dementia. Much of the history is taken from his friend/caretaker who is present at bedside.  Patient is a 81 y.o. male presenting with fall. The history is provided by the patient.  Fall This is a new problem. The current episode started 2 days ago. The problem has not changed since onset.Pertinent negatives include no chest pain and no abdominal pain. Nothing aggravates the symptoms. Nothing relieves the symptoms. He has tried nothing for the symptoms. The treatment provided no relief.    Past Medical History  Diagnosis Date  . Osteoporosis   . BPH (benign prostatic hyperplasia)   . Hyperlipidemia   . BPH (benign prostatic hyperplasia)   . Anemia   . Macular degeneration   . Atrial fibrillation   . Parkinsonism    Past Surgical History  Procedure Laterality Date  . Tonsilectomy/adenoidectomy with myringotomy     Family History  Problem Relation Age of Onset  . Heart disease Mother   . Heart disease Father    History   Substance Use Topics  . Smoking status: Never Smoker   . Smokeless tobacco: Not on file  . Alcohol Use: No     Comment: none    Review of Systems  Cardiovascular: Negative for chest pain.  Gastrointestinal: Negative for abdominal pain.  All other systems reviewed and are negative.     Allergies  Penicillins and Sulfonamide derivatives  Home Medications   Prior to Admission medications   Medication Sig Start Date End Date Taking? Authorizing Provider  amLODipine (NORVASC) 10 MG tablet Take 1 tablet (10 mg total) by mouth daily. 12/27/13   Smitty CordsAlexander J Karamalegos, DO  aspirin 81 MG tablet Take 81 mg by mouth daily.    Historical Provider, MD  finasteride (PROSCAR) 5 MG tablet Take 1 tablet (5 mg total) by mouth daily. 03/10/14   Smitty CordsAlexander J Karamalegos, DO  fish oil-omega-3 fatty acids 1000 MG capsule Take 1 g by mouth 2 (two) times daily.     Historical Provider, MD  Multiple Vitamin (MULTIVITAMIN WITH MINERALS) TABS tablet Take 1 tablet by mouth daily.    Historical Provider, MD  nystatin (MYCOSTATIN/NYSTOP) 100000 UNIT/GM POWD Apply to scrotum TID 05/18/14   Standley Brookinganiel P Goodrich, MD  tamsulosin (FLOMAX) 0.4 MG CAPS capsule Take 2 capsules (0.8 mg total) by mouth daily after supper. 06/14/14   Alexander J Karamalegos, DO   BP 138/84 mmHg  Pulse 75  Temp(Src) 97.8 F (36.6 C) (Oral)  Resp 18  SpO2 98% Physical Exam  Constitutional: He is oriented to  person, place, and time. He appears well-developed and well-nourished. No distress.  HENT:  Head: Normocephalic and atraumatic.  Mouth/Throat: Oropharynx is clear and moist.  Eyes: EOM are normal. Pupils are equal, round, and reactive to light.  Neck: Normal range of motion. Neck supple.  Cardiovascular: Normal heart sounds.   No murmur heard. Heart is irregularly irregular  Pulmonary/Chest: Breath sounds normal. No respiratory distress. He has no wheezes.  Abdominal: Soft. Bowel sounds are normal. He exhibits no distension. There  is no tenderness.  Musculoskeletal: Normal range of motion. He exhibits no edema.  Neurological: He is alert and oriented to person, place, and time. No cranial nerve deficit. Coordination normal.  Skin: Skin is warm and dry. He is not diaphoretic.  Nursing note and vitals reviewed.   ED Course  Procedures (including critical care time) Labs Review Labs Reviewed  CBC WITH DIFFERENTIAL/PLATELET  COMPREHENSIVE METABOLIC PANEL  URINALYSIS, ROUTINE W REFLEX MICROSCOPIC (NOT AT [facility])    Imaging Review No results found.   EKG Interpretation None      MDM   Final diagnoses:  None    Patient presents here for evaluation of a fall that occurred earlier today. His caretaker at bedside reports that he has been progressively weaker recently. He saw his urologist today, then was sent here to be evaluated. He appears somewhat confused, but is otherwise neurologically intact and at his baseline according to his caretaker. CT scan of the head is negative and laboratory studies are unremarkable. He has an indwelling Foley catheter and urine obtained appears infected. This will be treated with Levaquin.  Case management was consulted and arrangements will be made for the patient to have home health and even possibly involve hospice as the patient has expressed an interest in this.    Geoffery Lyons, MD 06/16/14 2329

## 2014-06-15 NOTE — Progress Notes (Signed)
Per chart review [facility].  Patient discharged with home health services for PT with [facility].

## 2014-06-15 NOTE — ED Notes (Signed)
Pt with Hx of multiple falls and Parkinsons fell today, landed first on his knees, then fell forward onto his face. Pt has multiple abrasions from previous falls to face, knees, and arms. Pt states that he "wants to die," and wants to be on hospice care. Pt tearful, recently hospitalize for bladder and testicle infection. Pt has foley catheter in place.

## 2014-06-16 ENCOUNTER — Telehealth: Payer: Self-pay | Admitting: *Deleted

## 2014-06-16 NOTE — Telephone Encounter (Signed)
Tamela OddiBetsy, RN with [facility] called requesting an order for the referral placed by the ED physician.  She stated that the ED physician requested that the order come from pt's PCP.  Please give her a call at [phone] or fax order to [phone].  Clovis PuMartin, Saxton Chain L, RN

## 2014-06-16 NOTE — Telephone Encounter (Signed)
Returned call to [facility] to confirm plans for patient to start Home Hospice & Palliative Care, given verbal order to confirm this referral, multiple diagnoses as indications such as chronic heart disease (AFib, HTN), Chronic Dementia, and Parkinson's disease. Patient to be established and follow-up routinely with hospice. As PCP, gave verbal consent to be Hospice attending to co-manage this patient with palliative care physician who will oversee symptom management, and I will anticipate signing orders as needed.  Saralyn PilarAlexander Raizel Wesolowski, DO [facility], PGY-2

## 2014-07-13 ENCOUNTER — Ambulatory Visit: Payer: PPO | Admitting: Internal Medicine

## 2014-07-25 ENCOUNTER — Telehealth: Payer: Self-pay | Admitting: Family Medicine

## 2014-07-25 NOTE — Telephone Encounter (Signed)
FYI to PCP

## 2014-07-25 NOTE — Telephone Encounter (Signed)
Aware of update, and will stay tuned for further updates.  Saralyn PilarAlexander Karamalegos, DO [facility], PGY-3

## 2014-07-25 NOTE — Telephone Encounter (Signed)
[facility] called and wanted the doctor to know that the has rested for 5 night in a row and will be discharged to home in 5 days. jw

## 2014-08-09 ENCOUNTER — Telehealth: Payer: Self-pay | Admitting: Family Medicine

## 2014-08-09 NOTE — Telephone Encounter (Signed)
Reviewed note from [facility]) regarding request for verbal order authorization for weekly acid flush for patient. I attempted to call back and reached [name]'s voicemail. Left a voice message to provide this verbal authorization to proceed with this order.  I am routing this message to Dorisann Frames, RN, to provide additional verbal authorization if [facility] calls back.  Saralyn Pilar, DO [facility], PGY-3

## 2014-08-09 NOTE — Telephone Encounter (Signed)
[facility] calling to retrieve verbal orders to do an acid flush once a week for pt. Please contact [name] @ [phone] at the earliest convenience. Thank you, Dorothey Baseman, ASA

## 2014-09-06 ENCOUNTER — Telehealth: Payer: Self-pay | Admitting: *Deleted

## 2014-09-06 NOTE — Telephone Encounter (Signed)
I am happy to discontinue these meds. Tamika, can you authorize this for me? Is there something else I need to do?  Thanks

## 2014-09-06 NOTE — Telephone Encounter (Signed)
Verbal order given by Dr. Richarda Blade to discontinue the Flomax and Proscar given to [facility] from [facility] [place].  Clovis Pu, RN

## 2014-09-06 NOTE — Telephone Encounter (Signed)
[name] rn with [facility] called to see if they can get d/c orders for this patient for his flomax and proscar.  Patient has a foley catheter and their hospice provider feels like these medication are not necessary to continue.  Will forward to MD.  Burnard Hawthorne

## 2014-09-13 ENCOUNTER — Telehealth: Payer: Self-pay | Admitting: *Deleted

## 2014-09-13 NOTE — Telephone Encounter (Signed)
[facility] called and states that patient is now under the care of Hospice.

## 2014-09-25 ENCOUNTER — Telehealth: Payer: Self-pay | Admitting: Family Medicine

## 2014-09-25 NOTE — Telephone Encounter (Signed)
Hospice nurse, Nita Sells is calling because [facility] takes medication for his bladder, mirabegron  daily, which is extended release. However, pt still experiences spasms and pain in between doses. Would like to know if med can be prescribed for this pain to be taken in between doses or if the medication dose should be increased. Please advise at the earliest convenience. Thank you, Dorothey Baseman, ASA

## 2014-09-25 NOTE — Telephone Encounter (Signed)
Called hospice nurse [name] back regarding patient [facility].  1. Bladder Spasms / Chronic Pain - Reportedly he has been taking Myrbetriq-ER  daily with some good results but still has some complains of pelvic/bladder spasms and pain during the day between doses, does not seem to be lasting 24 hours. She is requesting alternative chronic pain medicine between doses. I recommended that he be evaluated for chronic pain management by the onsite doctor (Dr. Gibson Ramp) as he may need long-term symptom control while in hospice for comfort, and would defer to their management. He can continue Myrbetriq, no alternative at this time for his bladder spasms.  2. Foley Catheter Problems - Reportedly he continues to have foley catheter clogging. He is reportedly dependent on foley catheter and cannot void without it. They continue to use a rubber foley with frequent regular acid flushes. I agree with [name]'s recommendation to switch to a silicone foley catheter and increase frequency of flushes up to 3-4x daily as this may reduce clogging. I do not have other advice regarding changes to foley, he is dependent and cannot go without it. Previously followed by Urology, I advised if this change does not help, he may benefit from an evaluation by Urology for advice on chronic catheterization options to improve his comfort while in hospice.  Saralyn Pilar, DO [facility], PGY-3

## 2014-10-23 ENCOUNTER — Telehealth: Payer: Self-pay | Admitting: Family Medicine

## 2014-10-23 NOTE — Telephone Encounter (Signed)
Ajam the hospice nurse for the patient called and would like to change the Mirabegron to Pyridium since it works better for the patient. Also the patient had a 7 day supply of Diflucan 100 mg but he didn't get to finish this so the patient still has the issue. She would also like another 7 day supply for the patient. All she needs is the verbal orders to do this, Please call her at [phone]. Myriam Jacobson

## 2014-10-23 NOTE — Telephone Encounter (Signed)
Called [name] back at hospice, and provided verbal order authorization for switch from Myrbetriq to Pyridium. I advised that these medicines work completely differently and in future he may need to switch back to [facility] or take both, but will stay tuned for updates. He continues to have clogging in his foley catheter, they will increase acid flushes to 60cc TIW. Additionally he didn't complete prior Diflucan x 7 days due to respite care, and would like to complete this course, again verbal order authorization provided.  Saralyn Pilar, DO [facility], PGY-3

## 2014-11-24 ENCOUNTER — Encounter: Payer: Self-pay | Admitting: Family Medicine

## 2014-11-24 DIAGNOSIS — Z515 Encounter for palliative care: Secondary | ICD-10-CM | POA: Insufficient documentation

## 2014-12-22 ENCOUNTER — Encounter: Payer: Self-pay | Admitting: *Deleted

## 2014-12-22 NOTE — Telephone Encounter (Signed)
This encounter was created in error - please disregard.

## 2014-12-25 ENCOUNTER — Telehealth: Payer: Self-pay | Admitting: Family Medicine

## 2014-12-25 NOTE — Telephone Encounter (Signed)
Pt is having difficutly swallowing. Would like verbal order to get thick-it.

## 2014-12-25 NOTE — Telephone Encounter (Signed)
Called [name] back at hospice, regarding [facility]. She reports status update he is mostly bed-bound now and concerned with some dysphagia and considered aspiration risk, requested verbal order for thick-it. Authorization provided, no further actions required.  Saralyn PilarAlexander Karamalegos, DO [facility], PGY-3

## 2014-12-27 ENCOUNTER — Other Ambulatory Visit: Payer: Self-pay | Admitting: *Deleted

## 2014-12-27 DIAGNOSIS — I10 Essential (primary) hypertension: Secondary | ICD-10-CM

## 2014-12-27 MED ORDER — AMLODIPINE BESYLATE 10 MG PO TABS: 10.0000 mg | ORAL_TABLET | Freq: Every day | ORAL | Status: AC

## 2015-01-05 ENCOUNTER — Telehealth: Payer: Self-pay | Admitting: Family Medicine

## 2015-01-05 NOTE — Telephone Encounter (Signed)
 Hospice called to inform the Dr. Althea CharonKaramalegos that [facility] passed away 12/15/2014 at 7:01 am. [name]

## 2015-01-09 ENCOUNTER — Telehealth: Payer: Self-pay | Admitting: *Deleted

## 2015-01-09 NOTE — Telephone Encounter (Signed)
 Lucile CraterKarel, [facility] called to inform PCP that patient expired on  at 7:01 AM.  Please call with questions at [phone].  Clovis PuMartin, Dayna Geurts L, RN

## 2015-01-10 NOTE — Telephone Encounter (Signed)
Death certificate has been placed in Dr. Althea CharonKaramalegos' box for completion. Please return to me once completed.

## 2015-01-11 NOTE — Telephone Encounter (Addendum)
 Reviewed chart. Contacted [facility] and discussed manner of Mr [facility]'s passing. They reported chronic disease progression while under home hospice care with progressive decline, generalized weakness, lethargy, reduced appetite leading up to passing. No report of any trauma or fall immediately related to death. Pronounced dead at home  at 0701. See summary below of completed information on Death Certificate, completed and signed 01/11/2015.  Part I, Immediate cause of death: Parkinson's disease, with dementia (approx interval onset, years) Part II, other significant contributing diseases: Chronic Atrial Fibrillation, HTN (hypertension) No autopsy performed. Manner of Death: Natural Case was not referred to Medical examiner. Time of Death: 0701 () Did tobacco contribute to death - No  Completed original [facility] certificate [facility] for processing on 01/11/15.  Saralyn PilarAlexander Karamalegos, DO [facility], PGY-3

## 2015-01-14 DEATH — deceased

## 2016-01-24 IMAGING — CT CT HEAD W/O CM
2 series · 17 of 30 positions shown, 20 images · non-contrast
Comparison: CT and MRI scans October 24, 2012.

CLINICAL DATA: Facial injury after recent fall.

EXAM:
CT HEAD WITHOUT CONTRAST
TECHNIQUE: Contiguous axial images were obtained from the base of the skull
through the vertex without intravenous contrast.

[Series 2: head w/o · axial · non-contrast · 0.45mm/px · z∈[+865,+1000]mm · 9 of 35 slices shown, 12 images]
[im 4/35  brain]
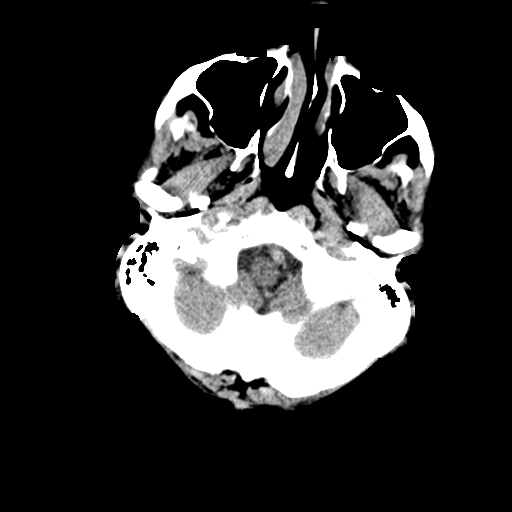
[im 4/35  bone]
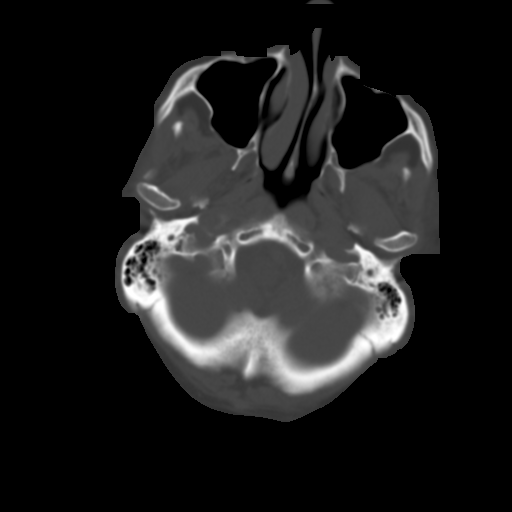
[im 7/35  brain]
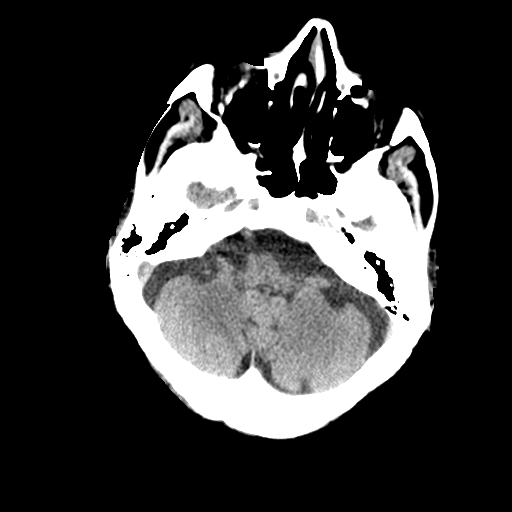
[im 11/35  brain]
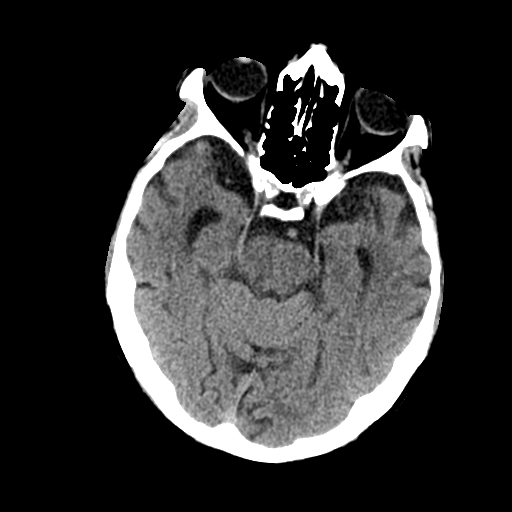
[im 14/35  brain]
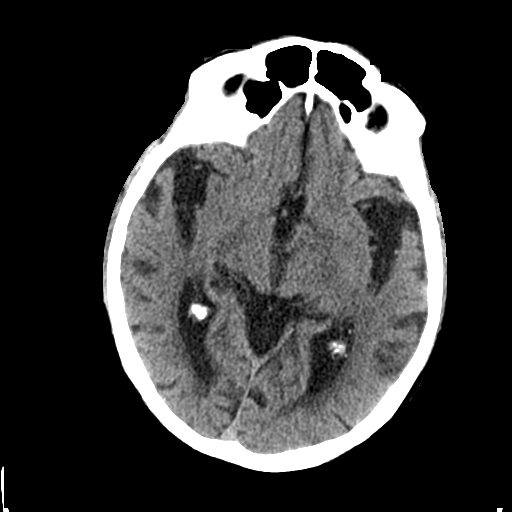
[im 18/35  brain]
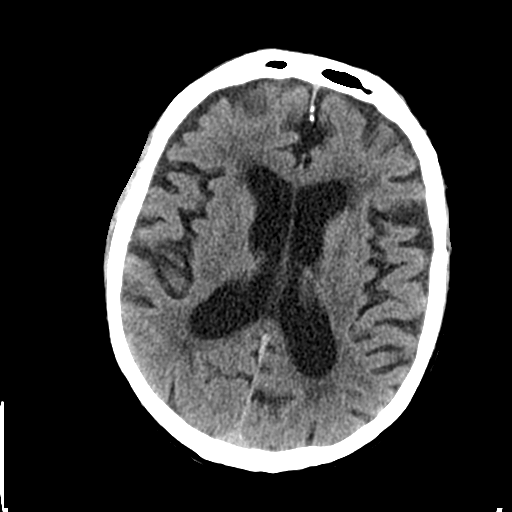
[im 18/35  bone]
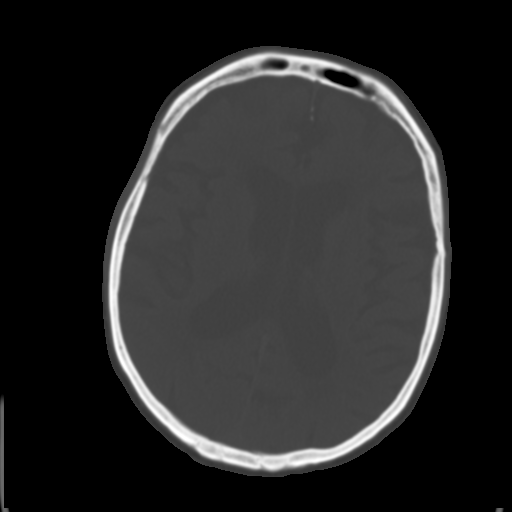
[im 21/35  brain]
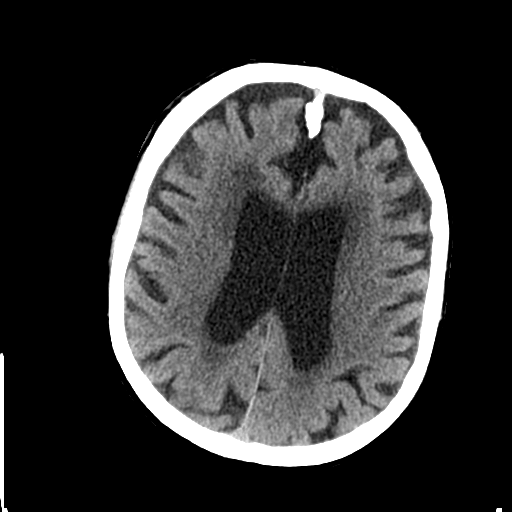
[im 24/35  brain]
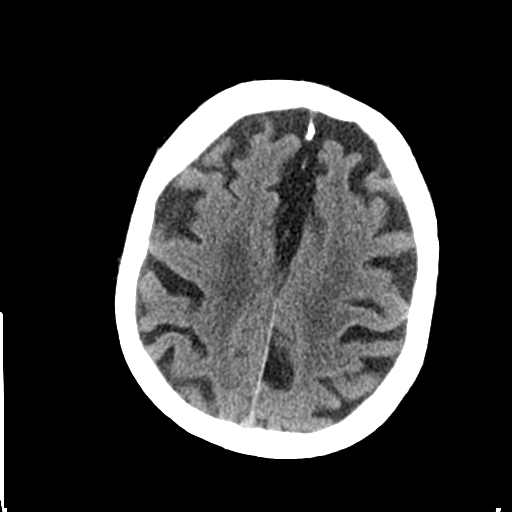
[im 28/35  brain]
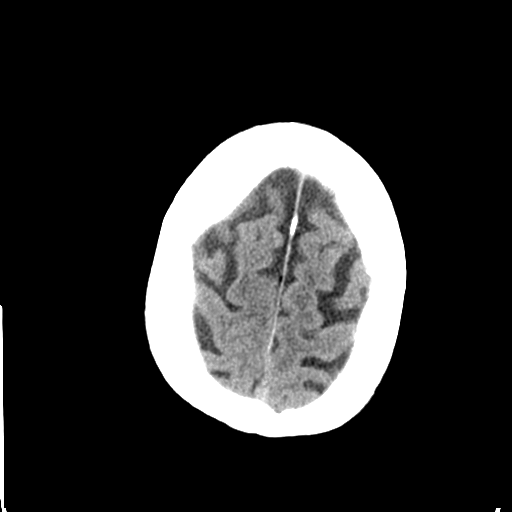
[im 31/35  brain]
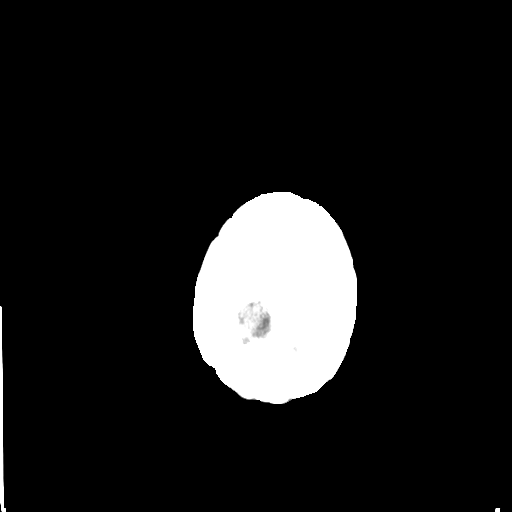
[im 31/35  bone]
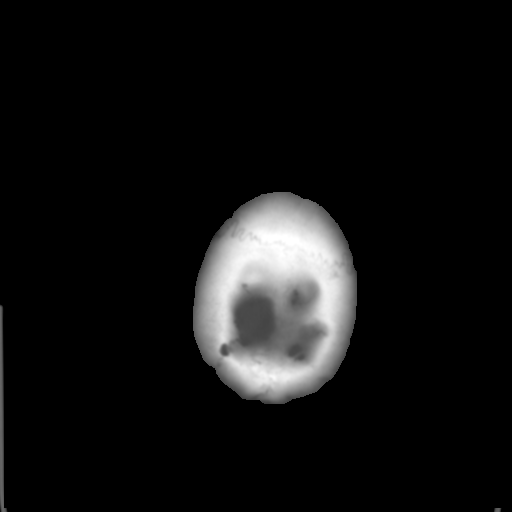

[Series 3: bone windows · axial · 0.45mm/px · z∈[+868,+997]mm · 8 of 57 slices shown]
[im 7/57  bone]
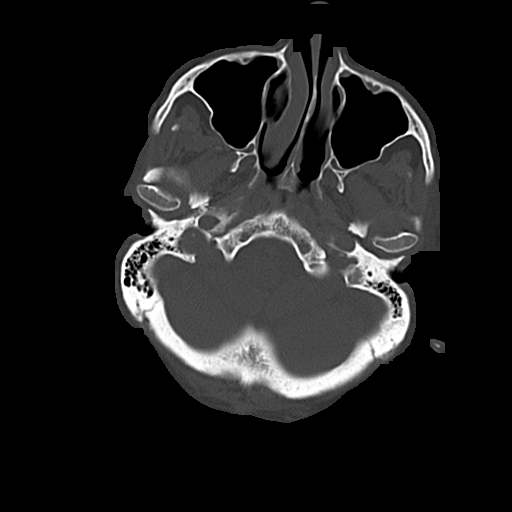
[im 13/57  bone]
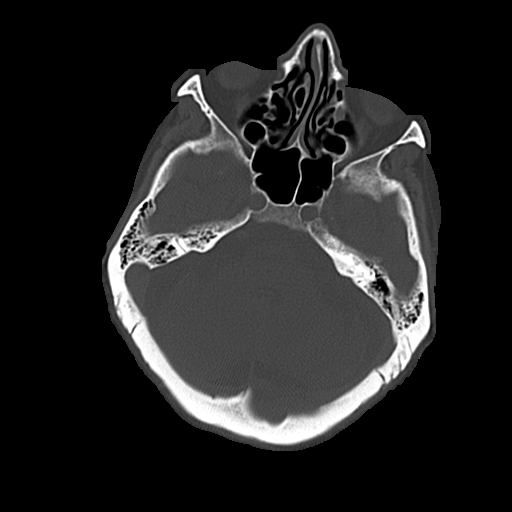
[im 19/57  bone]
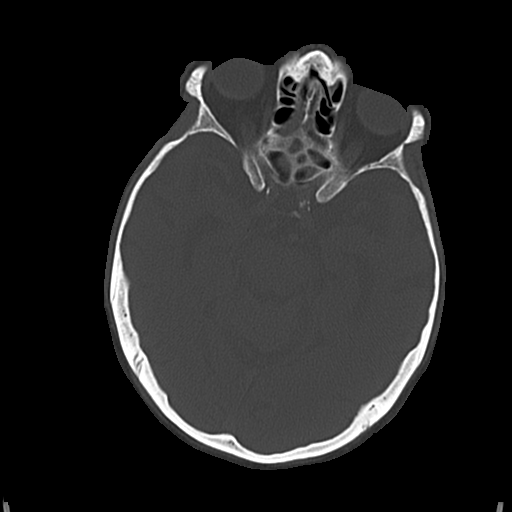
[im 25/57  bone]
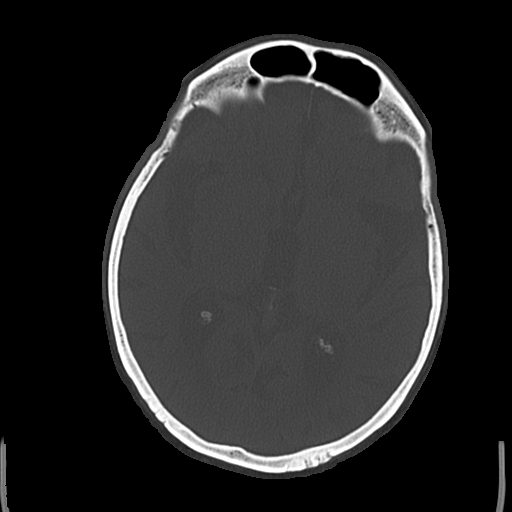
[im 32/57  bone]
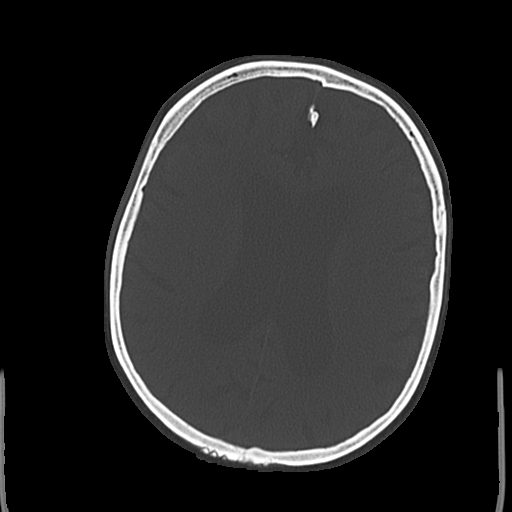
[im 38/57  bone]
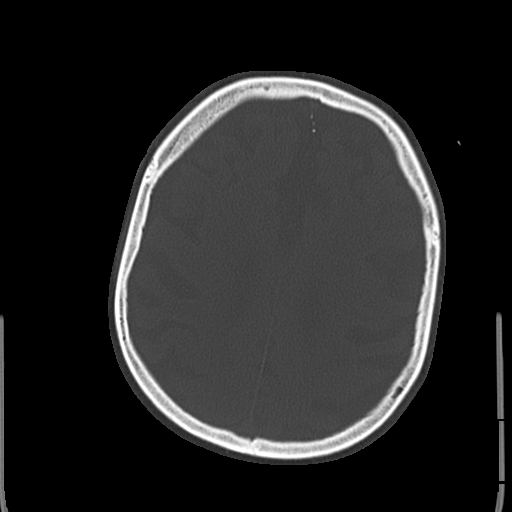
[im 44/57  bone]
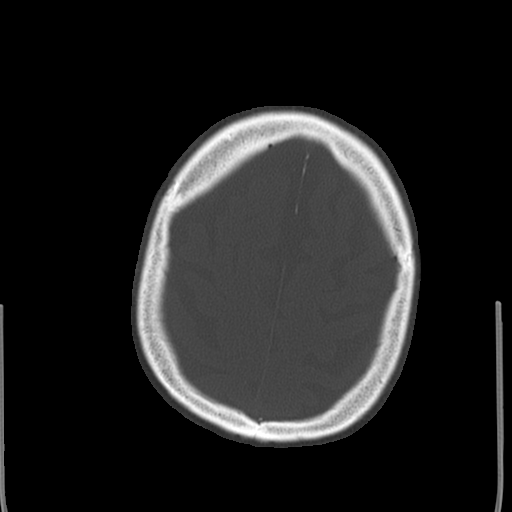
[im 50/57  bone]
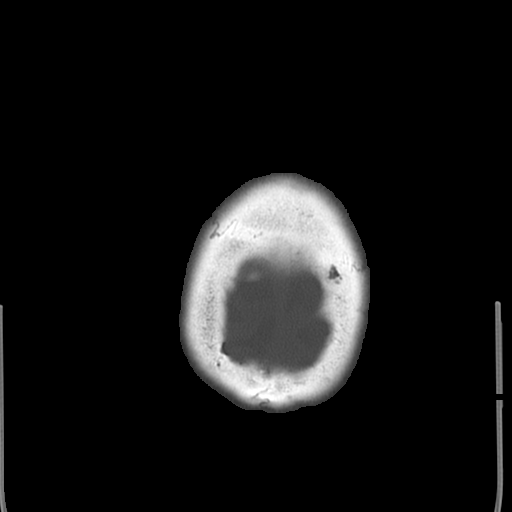

[17 of 30 positions shown; findings below may reference images not displayed]

FINDINGS: Bony calvarium appears intact. Mild diffuse cortical atrophy is
noted. Mild chronic ischemic white matter disease is noted. No mass
effect or midline shift is noted. Ventricular size is within normal
limits. There is no evidence of mass lesion, hemorrhage or acute
infarction.
IMPRESSION: Mild diffuse cortical atrophy. Mild chronic ischemic white matter
disease. No acute intracranial abnormality seen.
